# Patient Record
Sex: Female | Born: 1990 | Race: White | Hispanic: No | Marital: Single | Smoking: Never smoker
Health system: Southern US, Community
[De-identification: ages and names within clinical notes are randomized; demographics above are authoritative.]

## PROBLEM LIST (undated history)

## (undated) ENCOUNTER — Inpatient Hospital Stay (HOSPITAL_COMMUNITY): Payer: Self-pay

## (undated) DIAGNOSIS — R51 Headache: Secondary | ICD-10-CM

## (undated) DIAGNOSIS — I10 Essential (primary) hypertension: Secondary | ICD-10-CM

## (undated) HISTORY — PX: WISDOM TOOTH EXTRACTION: SHX21

## (undated) HISTORY — PX: CHOLECYSTECTOMY: SHX55

---
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
## 2008-01-14 ENCOUNTER — Ambulatory Visit: Payer: Self-pay | Admitting: Family Medicine

## 2008-01-21 ENCOUNTER — Emergency Department (HOSPITAL_COMMUNITY): Admission: EM | Admit: 2008-01-21 | Discharge: 2008-01-21 | Payer: Self-pay | Admitting: Family Medicine

## 2008-01-21 ENCOUNTER — Encounter: Payer: Self-pay | Admitting: *Deleted

## 2008-01-23 ENCOUNTER — Encounter (INDEPENDENT_AMBULATORY_CARE_PROVIDER_SITE_OTHER): Payer: Self-pay | Admitting: Family Medicine

## 2008-01-23 ENCOUNTER — Ambulatory Visit: Payer: Self-pay | Admitting: Family Medicine

## 2008-01-23 LAB — CONVERTED CEMR LAB
Antibody Screen: NEGATIVE
Basophils Absolute: 0.1 10*3/uL (ref 0.0–0.1)
Basophils Relative: 1 % (ref 0–1)
Eosinophils Absolute: 0.1 10*3/uL (ref 0.0–0.7)
Eosinophils Relative: 1 % (ref 0–5)
HCT: 38.3 % (ref 36.0–46.0)
Hemoglobin: 12.8 g/dL (ref 12.0–15.0)
Hepatitis B Surface Ag: NEGATIVE
Lymphocytes Relative: 30 % (ref 12–46)
Lymphs Abs: 2.4 10*3/uL (ref 0.7–4.0)
MCHC: 33.4 g/dL (ref 30.0–36.0)
MCV: 87.2 fL (ref 78.0–100.0)
Monocytes Absolute: 0.6 10*3/uL (ref 0.1–1.0)
Monocytes Relative: 7 % (ref 3–12)
Neutro Abs: 4.9 10*3/uL (ref 1.7–7.7)
Neutrophils Relative %: 61 % (ref 43–77)
Platelets: 332 10*3/uL (ref 150–400)
RBC: 4.39 M/uL (ref 3.87–5.11)
RDW: 12.8 % (ref 11.5–15.5)
Rh Type: POSITIVE
Rubella: 30.6 intl units/mL — ABNORMAL HIGH
Sickle Cell Screen: NEGATIVE
WBC: 8 10*3/uL (ref 4.0–10.5)

## 2008-01-24 ENCOUNTER — Encounter (INDEPENDENT_AMBULATORY_CARE_PROVIDER_SITE_OTHER): Payer: Self-pay | Admitting: Family Medicine

## 2008-01-30 ENCOUNTER — Other Ambulatory Visit: Admission: RE | Admit: 2008-01-30 | Discharge: 2008-01-30 | Payer: Self-pay | Admitting: Emergency Medicine

## 2008-01-30 ENCOUNTER — Encounter (INDEPENDENT_AMBULATORY_CARE_PROVIDER_SITE_OTHER): Payer: Self-pay | Admitting: Family Medicine

## 2008-01-30 ENCOUNTER — Ambulatory Visit: Payer: Self-pay | Admitting: Family Medicine

## 2008-01-30 LAB — CONVERTED CEMR LAB
Chlamydia, DNA Probe: NEGATIVE
GC Probe Amp, Genital: NEGATIVE
Glucose, Urine, Semiquant: NEGATIVE
Protein, U semiquant: NEGATIVE
Rapid Strep: POSITIVE

## 2008-02-02 ENCOUNTER — Ambulatory Visit (HOSPITAL_COMMUNITY): Admission: RE | Admit: 2008-02-02 | Discharge: 2008-02-02 | Payer: Self-pay | Admitting: Family Medicine

## 2008-02-02 ENCOUNTER — Encounter (INDEPENDENT_AMBULATORY_CARE_PROVIDER_SITE_OTHER): Payer: Self-pay | Admitting: Family Medicine

## 2008-02-04 ENCOUNTER — Telehealth (INDEPENDENT_AMBULATORY_CARE_PROVIDER_SITE_OTHER): Payer: Self-pay | Admitting: Family Medicine

## 2008-03-23 ENCOUNTER — Telehealth: Payer: Self-pay | Admitting: *Deleted

## 2008-03-24 ENCOUNTER — Ambulatory Visit: Payer: Self-pay | Admitting: Family Medicine

## 2008-04-02 ENCOUNTER — Encounter: Admission: RE | Admit: 2008-04-02 | Discharge: 2008-04-02 | Payer: Self-pay | Admitting: Family Medicine

## 2008-04-02 ENCOUNTER — Ambulatory Visit: Payer: Self-pay | Admitting: Family Medicine

## 2008-04-02 ENCOUNTER — Telehealth (INDEPENDENT_AMBULATORY_CARE_PROVIDER_SITE_OTHER): Payer: Self-pay | Admitting: *Deleted

## 2008-04-02 ENCOUNTER — Encounter: Payer: Self-pay | Admitting: Family Medicine

## 2008-04-06 ENCOUNTER — Ambulatory Visit: Payer: Self-pay | Admitting: Family Medicine

## 2008-04-21 ENCOUNTER — Ambulatory Visit (HOSPITAL_COMMUNITY): Admission: RE | Admit: 2008-04-21 | Discharge: 2008-04-21 | Payer: Self-pay | Admitting: Family Medicine

## 2008-04-24 ENCOUNTER — Inpatient Hospital Stay (HOSPITAL_COMMUNITY): Admission: AD | Admit: 2008-04-24 | Discharge: 2008-04-24 | Payer: Self-pay | Admitting: Obstetrics & Gynecology

## 2008-05-17 ENCOUNTER — Ambulatory Visit: Payer: Self-pay | Admitting: Family Medicine

## 2008-06-06 ENCOUNTER — Telehealth (INDEPENDENT_AMBULATORY_CARE_PROVIDER_SITE_OTHER): Payer: Self-pay | Admitting: Family Medicine

## 2008-06-14 ENCOUNTER — Ambulatory Visit: Payer: Self-pay | Admitting: Family Medicine

## 2008-06-14 ENCOUNTER — Encounter (INDEPENDENT_AMBULATORY_CARE_PROVIDER_SITE_OTHER): Payer: Self-pay | Admitting: Family Medicine

## 2008-06-23 ENCOUNTER — Ambulatory Visit: Payer: Self-pay | Admitting: Family Medicine

## 2008-06-23 ENCOUNTER — Inpatient Hospital Stay (HOSPITAL_COMMUNITY): Admission: AD | Admit: 2008-06-23 | Discharge: 2008-06-23 | Payer: Self-pay | Admitting: Obstetrics & Gynecology

## 2008-07-04 ENCOUNTER — Inpatient Hospital Stay (HOSPITAL_COMMUNITY): Admission: AD | Admit: 2008-07-04 | Discharge: 2008-07-04 | Payer: Self-pay | Admitting: Obstetrics & Gynecology

## 2008-07-04 ENCOUNTER — Ambulatory Visit: Payer: Self-pay | Admitting: Advanced Practice Midwife

## 2008-07-05 ENCOUNTER — Inpatient Hospital Stay (HOSPITAL_COMMUNITY): Admission: AD | Admit: 2008-07-05 | Discharge: 2008-07-05 | Payer: Self-pay | Admitting: Obstetrics & Gynecology

## 2008-07-05 ENCOUNTER — Ambulatory Visit: Payer: Self-pay | Admitting: Obstetrics and Gynecology

## 2008-07-05 ENCOUNTER — Encounter: Payer: Self-pay | Admitting: *Deleted

## 2008-07-06 ENCOUNTER — Ambulatory Visit: Payer: Self-pay | Admitting: Family Medicine

## 2008-07-14 ENCOUNTER — Encounter: Payer: Self-pay | Admitting: Family Medicine

## 2008-07-14 ENCOUNTER — Ambulatory Visit: Payer: Self-pay | Admitting: Family Medicine

## 2008-07-30 ENCOUNTER — Ambulatory Visit: Payer: Self-pay | Admitting: Family Medicine

## 2008-08-10 ENCOUNTER — Ambulatory Visit: Payer: Self-pay | Admitting: Advanced Practice Midwife

## 2008-08-10 ENCOUNTER — Inpatient Hospital Stay (HOSPITAL_COMMUNITY): Admission: AD | Admit: 2008-08-10 | Discharge: 2008-08-11 | Payer: Self-pay | Admitting: Obstetrics & Gynecology

## 2008-08-12 ENCOUNTER — Encounter (INDEPENDENT_AMBULATORY_CARE_PROVIDER_SITE_OTHER): Payer: Self-pay | Admitting: Family Medicine

## 2008-08-12 ENCOUNTER — Ambulatory Visit: Payer: Self-pay | Admitting: Family Medicine

## 2008-08-12 LAB — CONVERTED CEMR LAB
Alkaline Phosphatase: 153 units/L — ABNORMAL HIGH (ref 39–117)
CO2: 20 meq/L (ref 19–32)
Creatinine, Ser: 0.57 mg/dL (ref 0.40–1.20)
Glucose, Bld: 72 mg/dL (ref 70–99)
HCT: 34.6 % — ABNORMAL LOW (ref 36.0–46.0)
MCHC: 33.8 g/dL (ref 30.0–36.0)
MCV: 87.8 fL (ref 78.0–100.0)
Platelets: 293 10*3/uL (ref 150–400)
RBC: 3.94 M/uL (ref 3.87–5.11)
Total Bilirubin: 0.5 mg/dL (ref 0.3–1.2)
WBC: 8.7 10*3/uL (ref 4.0–10.5)

## 2008-08-14 ENCOUNTER — Encounter (INDEPENDENT_AMBULATORY_CARE_PROVIDER_SITE_OTHER): Payer: Self-pay | Admitting: Family Medicine

## 2008-08-14 LAB — CONVERTED CEMR LAB: Protein, Ur: 52 mg/24hr (ref 50–100)

## 2008-08-17 ENCOUNTER — Ambulatory Visit: Payer: Self-pay | Admitting: Family Medicine

## 2008-08-25 ENCOUNTER — Ambulatory Visit: Payer: Self-pay | Admitting: Family Medicine

## 2008-08-25 ENCOUNTER — Encounter: Payer: Self-pay | Admitting: Family Medicine

## 2008-08-25 LAB — CONVERTED CEMR LAB: Chlamydia, DNA Probe: NEGATIVE

## 2008-08-26 ENCOUNTER — Encounter (INDEPENDENT_AMBULATORY_CARE_PROVIDER_SITE_OTHER): Payer: Self-pay | Admitting: Family Medicine

## 2008-08-29 ENCOUNTER — Inpatient Hospital Stay (HOSPITAL_COMMUNITY): Admission: AD | Admit: 2008-08-29 | Discharge: 2008-08-29 | Payer: Self-pay | Admitting: Obstetrics & Gynecology

## 2008-09-02 ENCOUNTER — Ambulatory Visit: Payer: Self-pay | Admitting: Family Medicine

## 2008-09-02 ENCOUNTER — Encounter: Payer: Self-pay | Admitting: Family Medicine

## 2008-09-05 ENCOUNTER — Inpatient Hospital Stay (HOSPITAL_COMMUNITY): Admission: AD | Admit: 2008-09-05 | Discharge: 2008-09-05 | Payer: Self-pay | Admitting: Obstetrics and Gynecology

## 2008-09-07 ENCOUNTER — Ambulatory Visit: Payer: Self-pay | Admitting: Advanced Practice Midwife

## 2008-09-07 ENCOUNTER — Inpatient Hospital Stay (HOSPITAL_COMMUNITY): Admission: AD | Admit: 2008-09-07 | Discharge: 2008-09-07 | Payer: Self-pay | Admitting: Obstetrics & Gynecology

## 2008-09-08 ENCOUNTER — Inpatient Hospital Stay (HOSPITAL_COMMUNITY): Admission: AD | Admit: 2008-09-08 | Discharge: 2008-09-10 | Payer: Self-pay | Admitting: Obstetrics & Gynecology

## 2008-09-08 ENCOUNTER — Ambulatory Visit: Payer: Self-pay | Admitting: Family Medicine

## 2008-10-18 ENCOUNTER — Encounter: Payer: Self-pay | Admitting: Family Medicine

## 2008-10-18 ENCOUNTER — Ambulatory Visit: Payer: Self-pay | Admitting: Family Medicine

## 2008-10-18 LAB — CONVERTED CEMR LAB: Beta hcg, urine, semiquantitative: NEGATIVE

## 2008-12-09 ENCOUNTER — Emergency Department (HOSPITAL_COMMUNITY): Admission: EM | Admit: 2008-12-09 | Discharge: 2008-12-09 | Payer: Self-pay | Admitting: Family Medicine

## 2008-12-10 ENCOUNTER — Emergency Department (HOSPITAL_COMMUNITY): Admission: EM | Admit: 2008-12-10 | Discharge: 2008-12-10 | Payer: Self-pay | Admitting: Emergency Medicine

## 2008-12-11 ENCOUNTER — Emergency Department (HOSPITAL_COMMUNITY): Admission: EM | Admit: 2008-12-11 | Discharge: 2008-12-12 | Payer: Self-pay | Admitting: Emergency Medicine

## 2008-12-13 ENCOUNTER — Encounter (INDEPENDENT_AMBULATORY_CARE_PROVIDER_SITE_OTHER): Payer: Self-pay | Admitting: Surgery

## 2008-12-13 ENCOUNTER — Inpatient Hospital Stay (HOSPITAL_COMMUNITY): Admission: EM | Admit: 2008-12-13 | Discharge: 2008-12-14 | Payer: Self-pay | Admitting: Emergency Medicine

## 2008-12-15 ENCOUNTER — Telehealth: Payer: Self-pay | Admitting: Family Medicine

## 2008-12-29 ENCOUNTER — Ambulatory Visit: Payer: Self-pay | Admitting: Family Medicine

## 2008-12-29 LAB — CONVERTED CEMR LAB: Beta hcg, urine, semiquantitative: NEGATIVE

## 2009-03-01 ENCOUNTER — Emergency Department (HOSPITAL_COMMUNITY): Admission: EM | Admit: 2009-03-01 | Discharge: 2009-03-01 | Payer: Self-pay | Admitting: Emergency Medicine

## 2009-04-05 ENCOUNTER — Ambulatory Visit: Payer: Self-pay | Admitting: Family Medicine

## 2009-04-06 ENCOUNTER — Ambulatory Visit: Payer: Self-pay | Admitting: Family Medicine

## 2009-04-06 LAB — CONVERTED CEMR LAB: Beta hcg, urine, semiquantitative: NEGATIVE

## 2009-05-03 ENCOUNTER — Telehealth: Payer: Self-pay | Admitting: Family Medicine

## 2009-05-03 ENCOUNTER — Ambulatory Visit: Payer: Self-pay | Admitting: Family Medicine

## 2009-05-03 ENCOUNTER — Encounter: Payer: Self-pay | Admitting: Family Medicine

## 2009-06-24 ENCOUNTER — Ambulatory Visit: Payer: Self-pay | Admitting: Family Medicine

## 2009-06-24 ENCOUNTER — Telehealth: Payer: Self-pay | Admitting: Family Medicine

## 2009-08-26 ENCOUNTER — Ambulatory Visit: Payer: Self-pay | Admitting: Family Medicine

## 2009-08-26 DIAGNOSIS — M546 Pain in thoracic spine: Secondary | ICD-10-CM | POA: Insufficient documentation

## 2009-08-26 LAB — CONVERTED CEMR LAB
Beta hcg, urine, semiquantitative: NEGATIVE
Nitrite: NEGATIVE
Urobilinogen, UA: 0.2
WBC Urine, dipstick: NEGATIVE

## 2009-12-15 ENCOUNTER — Emergency Department (HOSPITAL_COMMUNITY): Admission: EM | Admit: 2009-12-15 | Discharge: 2009-12-15 | Payer: Self-pay | Admitting: Emergency Medicine

## 2009-12-27 ENCOUNTER — Ambulatory Visit: Payer: Self-pay | Admitting: Family Medicine

## 2009-12-27 ENCOUNTER — Encounter: Payer: Self-pay | Admitting: Family Medicine

## 2009-12-30 ENCOUNTER — Ambulatory Visit: Payer: Self-pay | Admitting: Family Medicine

## 2010-01-10 ENCOUNTER — Encounter: Payer: Self-pay | Admitting: Family Medicine

## 2010-01-10 ENCOUNTER — Ambulatory Visit: Payer: Self-pay | Admitting: Family Medicine

## 2010-01-10 LAB — CONVERTED CEMR LAB
Antibody Screen: NEGATIVE
Basophils Absolute: 0 10*3/uL (ref 0.0–0.1)
Basophils Relative: 0 % (ref 0–1)
Eosinophils Absolute: 0 10*3/uL (ref 0.0–0.7)
Hepatitis B Surface Ag: NEGATIVE
MCHC: 32.4 g/dL (ref 30.0–36.0)
MCV: 90 fL (ref 78.0–100.0)
Monocytes Relative: 6 % (ref 3–12)
Neutro Abs: 4.5 10*3/uL (ref 1.7–7.7)
Neutrophils Relative %: 63 % (ref 43–77)
Platelets: 347 10*3/uL (ref 150–400)
RDW: 13.2 % (ref 11.5–15.5)

## 2010-01-18 ENCOUNTER — Encounter: Payer: Self-pay | Admitting: *Deleted

## 2010-01-18 ENCOUNTER — Inpatient Hospital Stay (HOSPITAL_COMMUNITY): Admission: AD | Admit: 2010-01-18 | Discharge: 2010-01-18 | Payer: Self-pay | Admitting: Obstetrics & Gynecology

## 2010-01-25 ENCOUNTER — Ambulatory Visit: Payer: Self-pay | Admitting: Family Medicine

## 2010-01-25 ENCOUNTER — Encounter: Payer: Self-pay | Admitting: *Deleted

## 2010-01-25 LAB — CONVERTED CEMR LAB
Pap Smear: NEGATIVE
Whiff Test: NEGATIVE

## 2010-02-01 ENCOUNTER — Encounter: Payer: Self-pay | Admitting: Family Medicine

## 2010-02-02 ENCOUNTER — Ambulatory Visit (HOSPITAL_COMMUNITY): Admission: RE | Admit: 2010-02-02 | Discharge: 2010-02-02 | Payer: Self-pay | Admitting: Family Medicine

## 2010-02-02 ENCOUNTER — Encounter: Payer: Self-pay | Admitting: Family Medicine

## 2010-02-21 ENCOUNTER — Encounter: Payer: Self-pay | Admitting: Family Medicine

## 2010-02-21 ENCOUNTER — Ambulatory Visit: Payer: Self-pay | Admitting: Family Medicine

## 2010-02-21 LAB — CONVERTED CEMR LAB
Albumin: 3.8 g/dL (ref 3.5–5.2)
BUN: 6 mg/dL (ref 6–23)
Bilirubin Urine: NEGATIVE
Calcium: 9.2 mg/dL (ref 8.4–10.5)
Chloride: 105 meq/L (ref 96–112)
Glucose, Bld: 101 mg/dL — ABNORMAL HIGH (ref 70–99)
Potassium: 3.7 meq/L (ref 3.5–5.3)
Specific Gravity, Urine: >=1.03
Urobilinogen, UA: 4

## 2010-03-15 ENCOUNTER — Encounter: Payer: Self-pay | Admitting: Family Medicine

## 2010-03-15 ENCOUNTER — Ambulatory Visit (HOSPITAL_COMMUNITY): Admission: RE | Admit: 2010-03-15 | Discharge: 2010-03-15 | Payer: Self-pay | Admitting: Family Medicine

## 2010-03-20 ENCOUNTER — Ambulatory Visit: Payer: Self-pay | Admitting: Family Medicine

## 2010-04-13 ENCOUNTER — Ambulatory Visit: Payer: Self-pay

## 2010-04-28 ENCOUNTER — Ambulatory Visit: Payer: Self-pay | Admitting: Family Medicine

## 2010-05-04 ENCOUNTER — Telehealth: Payer: Self-pay | Admitting: Family Medicine

## 2010-05-04 ENCOUNTER — Inpatient Hospital Stay (HOSPITAL_COMMUNITY)
Admission: AD | Admit: 2010-05-04 | Discharge: 2010-05-05 | Payer: Self-pay | Source: Home / Self Care | Attending: Obstetrics & Gynecology | Admitting: Obstetrics & Gynecology

## 2010-05-04 LAB — CBC
HCT: 36.7 % (ref 36.0–46.0)
Hemoglobin: 12.8 g/dL (ref 12.0–15.0)
MCH: 30.7 pg (ref 26.0–34.0)
MCHC: 34.9 g/dL (ref 30.0–36.0)
MCV: 88 fL (ref 78.0–100.0)
Platelets: 295 10*3/uL (ref 150–400)
RBC: 4.17 MIL/uL (ref 3.87–5.11)
RDW: 13.1 % (ref 11.5–15.5)
WBC: 10 10*3/uL (ref 4.0–10.5)

## 2010-05-07 IMAGING — CR DG ABDOMEN ACUTE W/ 1V CHEST
3 series · 3 of 3 positions shown · non-contrast
Comparison: None

CLINICAL DATA: Back and abdominal pain.

ACUTE ABDOMEN SERIES (ABDOMEN 2 VIEW & CHEST 1 VIEW)

[w chest pa]
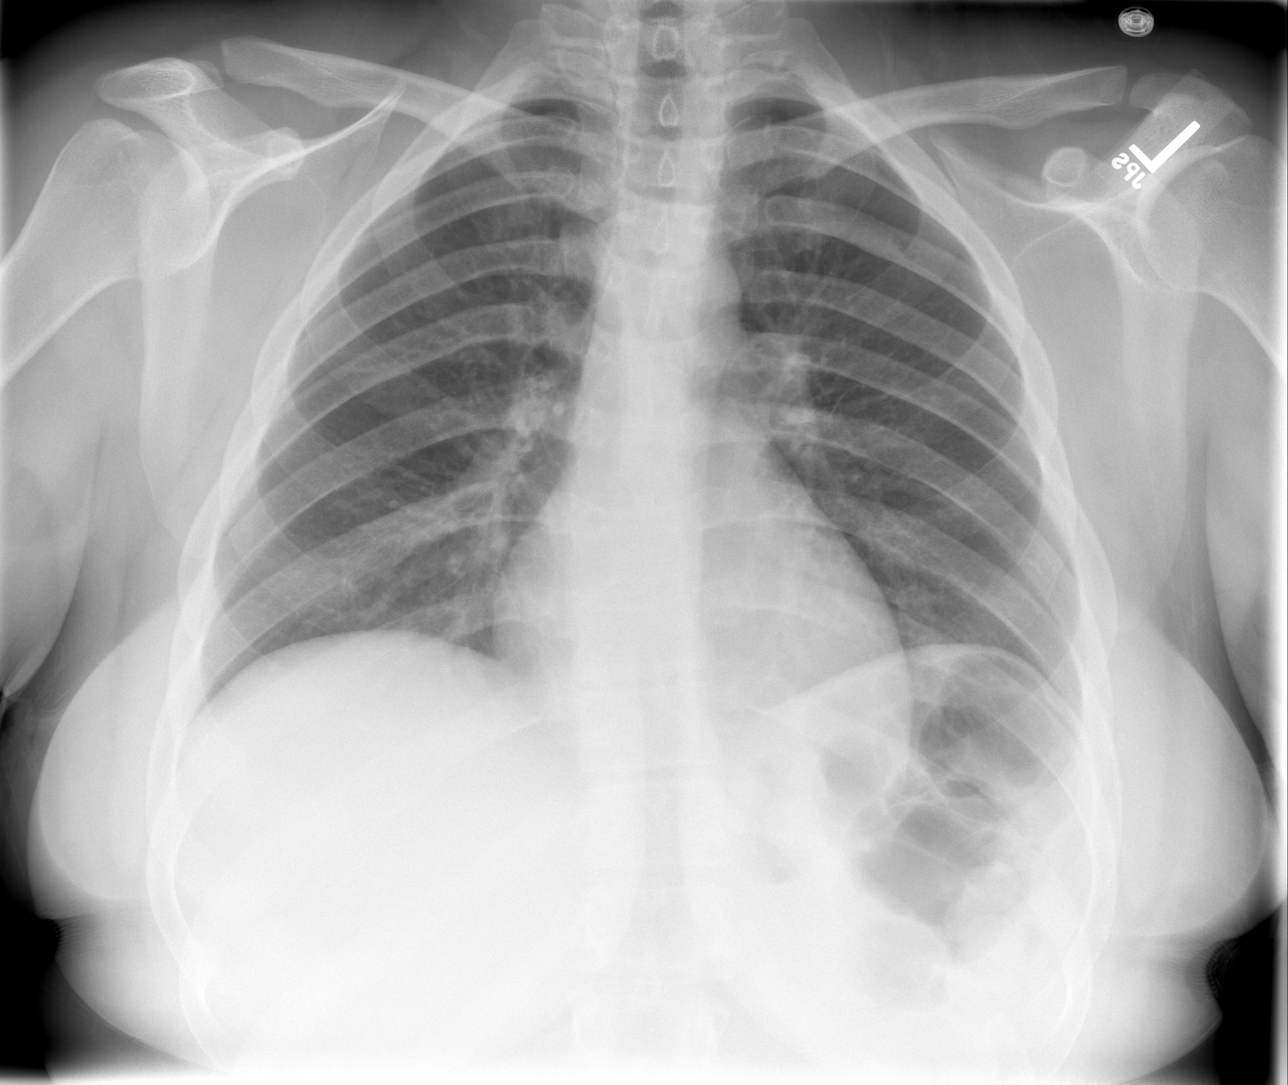

[w abdomen upright]
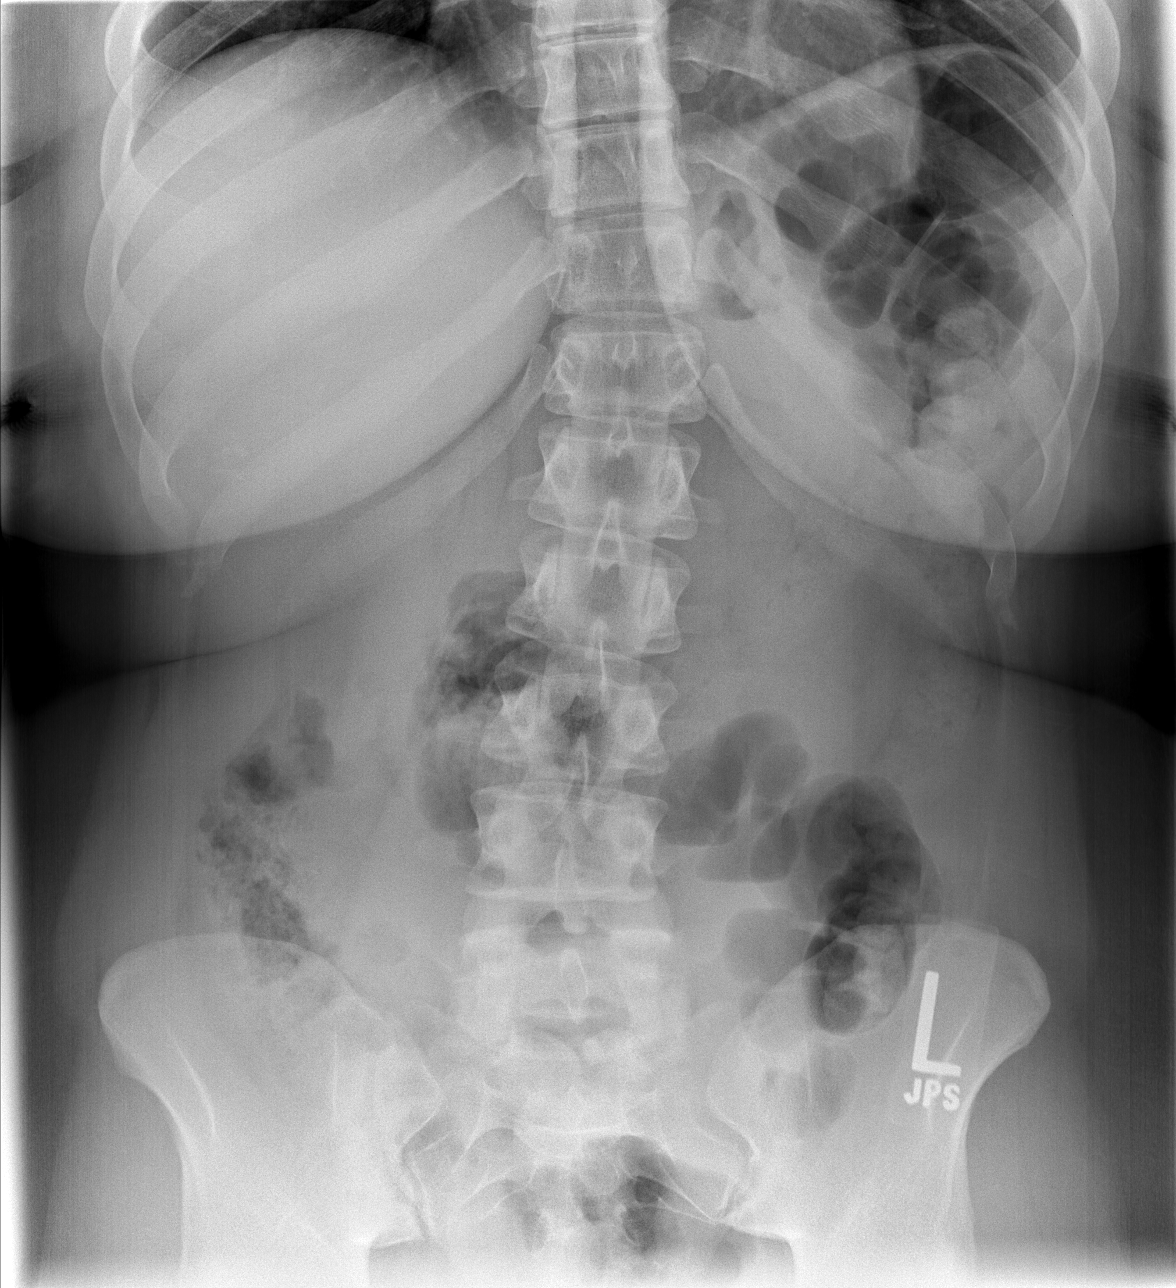

[t abdomen supine]
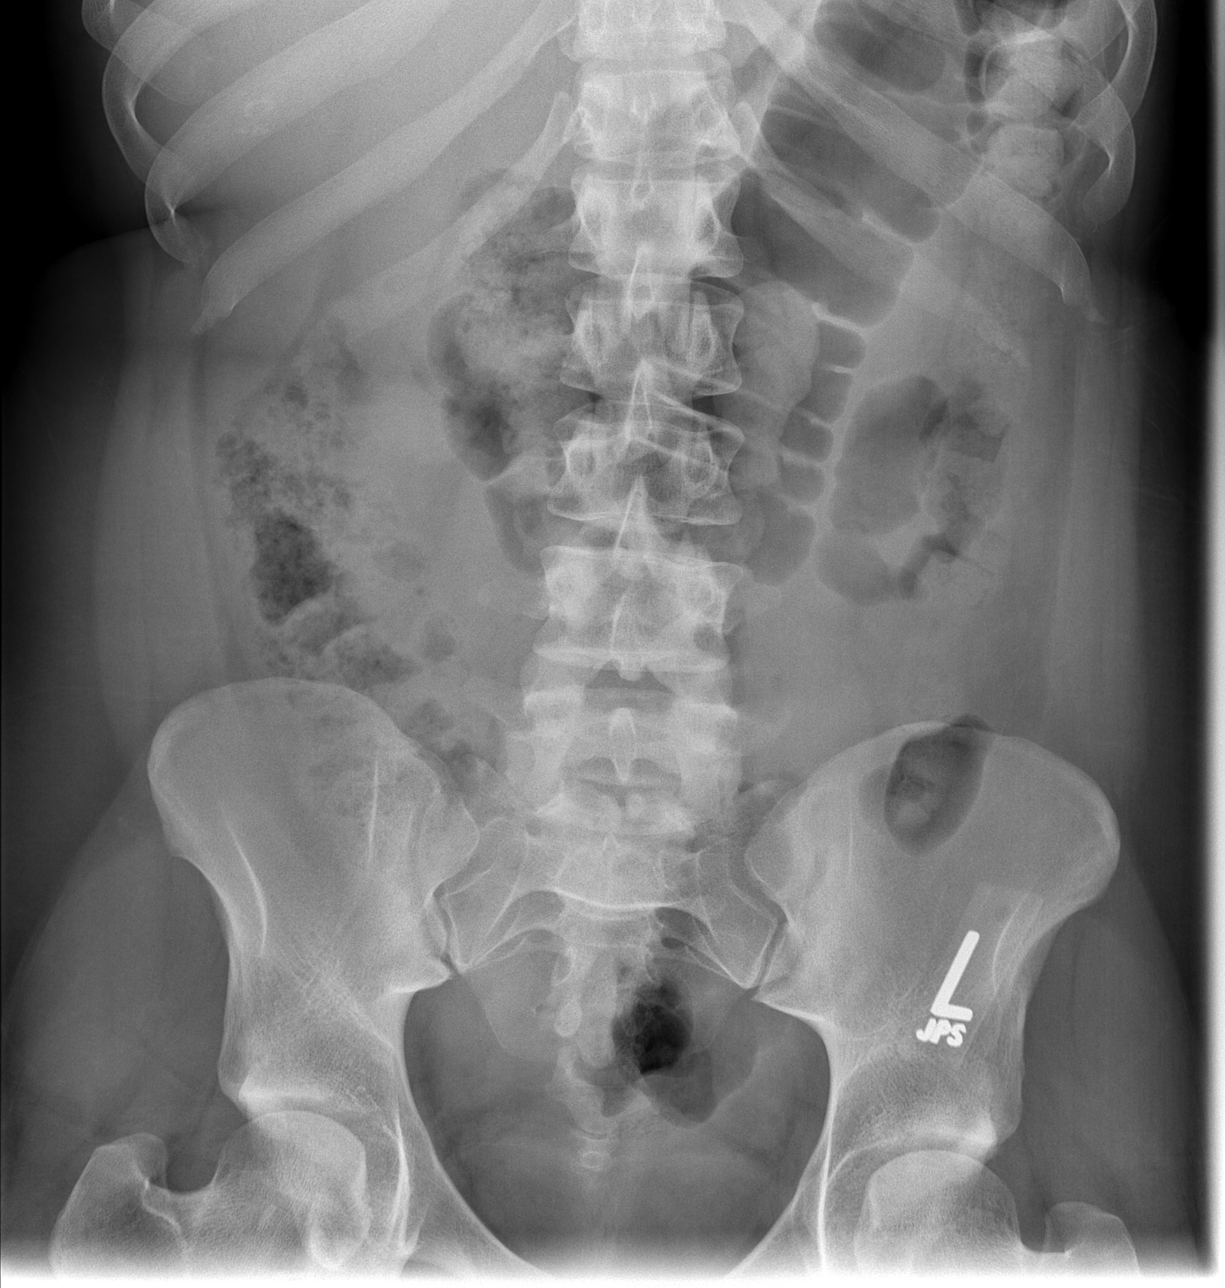

[3 of 3 positions shown; findings below may reference images not displayed]

FINDINGS: The upright chest x-ray demonstrates no acute
cardiopulmonary findings.  Low lung volumes with minimal basilar
atelectasis.

Two views of the abdomen demonstrate an unremarkable bowel gas
pattern.  There is scattered air and stool throughout the colon.
No dilated loops of small bowel to suggest obstruction.  No free
air.  The soft tissue shadows of the abdomen are maintained.  No
worrisome calcifications are seen.  The bony structures are
unremarkable.  There is a mild thoracolumbar scoliotic curvature
noted.
IMPRESSION: 1.  No acute cardiopulmonary findings.
2.  No plain film findings for acute abdominal process.

## 2010-05-10 ENCOUNTER — Ambulatory Visit
Admission: RE | Admit: 2010-05-10 | Discharge: 2010-05-10 | Payer: Self-pay | Source: Home / Self Care | Attending: Family Medicine | Admitting: Family Medicine

## 2010-05-10 ENCOUNTER — Encounter: Payer: Self-pay | Admitting: Family Medicine

## 2010-05-10 DIAGNOSIS — A088 Other specified intestinal infections: Secondary | ICD-10-CM | POA: Insufficient documentation

## 2010-05-15 ENCOUNTER — Encounter: Payer: Self-pay | Admitting: Family Medicine

## 2010-05-15 ENCOUNTER — Ambulatory Visit: Admission: RE | Admit: 2010-05-15 | Discharge: 2010-05-15 | Payer: Self-pay | Source: Home / Self Care

## 2010-05-15 LAB — CONVERTED CEMR LAB
HIV: NONREACTIVE
MCHC: 33.2 g/dL (ref 30.0–36.0)
MCV: 89.6 fL (ref 78.0–100.0)
Platelets: 298 10*3/uL (ref 150–400)
RDW: 13.2 % (ref 11.5–15.5)

## 2010-05-17 LAB — GLUCOSE, CAPILLARY: Glucose-Capillary: 101 mg/dL — ABNORMAL HIGH (ref 70–99)

## 2010-05-18 ENCOUNTER — Ambulatory Visit: Admit: 2010-05-18 | Payer: Self-pay | Admitting: Family Medicine

## 2010-05-20 ENCOUNTER — Encounter: Payer: Self-pay | Admitting: Family Medicine

## 2010-05-21 ENCOUNTER — Encounter: Payer: Self-pay | Admitting: Family Medicine

## 2010-05-22 ENCOUNTER — Encounter: Payer: Self-pay | Admitting: Emergency Medicine

## 2010-05-29 ENCOUNTER — Ambulatory Visit: Admit: 2010-05-29 | Payer: Self-pay

## 2010-05-30 NOTE — Assessment & Plan Note (Signed)
Summary: NOB/KH   Vital Signs:  Patient profile:   20 year old female LMP:     11/02/2009 Height:      67.5 inches Weight:      255 pounds Pulse rate:   80 / minute BP sitting:   116 / 75  Vitals Entered By: Jone Baseman CMA (January 25, 2010 3:44 PM)  Primary Care Provider:  Angelena Sole MD   History of Present Illness: No questions or concerns  Was seen at [facility] for small amount of vaginal bleeding.  No bleeding since then.  They treated her for a UTI.  Has had problems with nausea and has been taking Phenergan as needed.  Habits & Providers  Alcohol-Tobacco-Diet     Cigarette Packs/Day: n/a  Current Medications (verified): 1)  Zofran 4 Mg Tabs (Ondansetron Hcl) .Marland Kitchen.. 1 By Mouth Q6 Hrs As Needed Nausea 2)  Ranitidine Hcl 150 Mg Caps (Ranitidine Hcl) .... Take 1 Pill Two Times A Day For Heartburn. 3)  Flintstones Complete 60 Mg Chew (Pediatric Multivit-Minerals-C)  Allergies: No Known Drug Allergies  Past History:  Past Medical History: Reviewed history from 01/14/2008 and no changes required. chronic low back pain h/o `hairline fracture` of L hip playing bball 06 obesity   Family History: Reviewed history from 06/03/2007 and no changes required. DM type 2 in all grandparents Father with `heart trouble` and ?TIA in 80s - `he won`t talk about it` Half brothers with asthma, bipolar, and ADHD Mother healthy HTN- grandma  Social History: Reviewed history from 12/27/2009 and no changes required. Lives with toddler son. Currently works at [employer]. Non-smoker. Education:  freshman in college  Review of Systems  The patient denies fever, weight loss, syncope, peripheral edema, prolonged cough, headaches, and abdominal pain.    Physical Exam  General:  Vitals reviewed.  obese, no acute distress Eyes:  PERRL Mouth:  pharynx pink and moist.  pharynx pink and moist.   Neck:  supple, full ROM, and no masses.  supple, full ROM, and no masses.   Lungs:   normal respiratory effort, normal breath sounds, no crackles, and no wheezes.  normal respiratory effort, normal breath sounds, no crackles, and no wheezes.   Heart:  normal rate, regular rhythm, and no murmur.  normal rate, regular rhythm, and no murmur.   Abdomen:  obese, S/NT/ND, gravid c/w dates Genitalia:  normal introitus, no external lesions, no vaginal discharge, and mucosa pink and moist.  cervical friability.  White discharge Extremities:  no lower extremity edema Neurologic:  alert & oriented X3, cranial nerves II-XII intact, and strength normal in all extremities.   Skin:  turgor normal and color normal.   Psych:  Oriented X3, not anxious appearing, and not depressed appearing.     Impression & Recommendations:  Problem # 1:  PREGNANCY, MULTIGRAVIDA (ICD-V22.1) Assessment Unchanged  G3P1011.  Having some issues with nausea but is otherwise doing okay.  Will have her set up for ultrasound to help with dates and anatomy.  Also have her set up for Quad screen.  PAP smear today.  Other labs look okay.  She will need an early 1 hour glucola given obesity, AA, and family history.  Would do at next visit.  She is also having some vaginal discharge so will check a wet prep.  Orders: Other OB visit- [facility] ([facility])  Problem # 2:  MORNING SICKNESS (ICD-643.00) Assessment: Unchanged Still having problems with nausea.  She is taking the Phenergan only once in a while.  I educated her about the risks of taking Phenergan and she voiced her understanding.  Complete Medication List: 1)  Zofran 4 Mg Tabs (Ondansetron hcl) .Marland Kitchen.. 1 by mouth q6 hrs as needed nausea 2)  Ranitidine Hcl 150 Mg Caps (Ranitidine hcl) .... Take 1 pill two times a day for heartburn. 3)  Flintstones Complete 60 Mg Chew (Pediatric multivit-minerals-c)  Other Orders: [facility] ([id number]) [facility] Prenatal U/S > 14 weeks - 91478 (Prenatal U/S) AFP/Quad Scr-[facility] ([id number])  Patient Instructions: 1)   It was good to see you today 2)  I will let you know the results of the PAP smear 3)  We will also get you set up for an ultrasound for dates and sex of the baby 4)  Please schedule a follow up appointment with Dr. Gwendolyn Grant in 4 weeks   OB Initial Intake Information    Positive HCG by: Urgent Care    Race: White    Marital status: Single    Occupation: Conservation officer, [employer] for [employer]    Type of work: Copywriter, advertising ed    Education (last grade completed): freshman in college    Number of children at home: 1    [facility]    Newborn's physician: shaw  FOB Information    Husband/Father of baby: Kendra [name]    FOB occupation [employer]    Phone: [phone]    FOB Comments: Very involved.   Menstrual History    LMP (date): 11/02/2009    EDC by LMP: 08/09/2010    LMP - Character: light    Menarche: 11 years    Menses interval: 30 days    Menstrual flow 7 days    On BCP's at conception: no   Flowsheet View for Follow-up Visit    Estimated weeks of       gestation:     16.1    Weight:     255    Blood pressure:   116 / 75    Headache:     No    Nausea/vomiting:   vom2/d    Edema:     0    Vaginal bleeding:   no    Vaginal discharge:   d/c    Fundal height:      17    FHR:       160    Fetal activity:     no    Labor symptoms:   no    Taking prenatal vits?   Y    Smoking:     n/a    Next visit:     4 wk    Resident:     Lelon Perla    Preceptor:     Jennette Kettle  Prenatal Visit [facility] Confirmation:    Last menses onset (LMP) date: 11/02/2009    [facility] by LMP: 08/09/2010   Past Pregnancy History    Gravida:     3    Term Births:     1    Living Children:   1    Para:       1    Aborta:     1    Spont. Ab:     1  Pregnancy # 1    Delivery date:     09/08/2008    Weeks Gestation:   38    Preterm labor:     no    Delivery type:     NSVD  Anesthesia type:     epidural    Infant Sex:     Female    Birth weight:     6#2    Name:     Kendra [name]    Comments:      No complications  Pregnancy # 2    Delivery date:     01/08/2007    Comments:     Spontaneous abortion   Genetic History    Father of baby:   Kendra [name]     Thalassemia:         father: no    Neural tube defect:       father: no    Down's Syndrome:       father: no    Tay-Sachs:         father: no    Sickle Cell Dz/Trait:       father: no    Hemophilia:         father: no    Muscular Dystrophy:       father: no    Cystic Fibrosis:       father: no    Huntington's Dz:       father: no    Mental Retardation:       father: no    Fragile X:         father: no    Other Genetic or       Chromosomal Dz:       father: no    Child with other       birth defect:         father: no  Laboratory Results  Date/Time Received: January 25, 2010 4:34 PM  Date/Time Reported: January 25, 2010 4:38 PM   [employer] Source: vaginal WBC/hpf: 10-20 Bacteria/hpf: 3+  Cocci Clue cells/hpf: few  Negative whiff Yeast/hpf: none Trichomonas/hpf: none Comments: rod bacteria also present ............test performed by...........Marland KitchenTerese Door, CMA     Appended Document: NOB/KH    Clinical Lists Changes  Observations: Added new observation of PRECEPTOR: Jennette Kettle (01/26/2010 11:00) Added new observation of RESIDENT MD: Lelon Perla (01/26/2010 11:00) Added new observation of RTC: 4 wk (01/26/2010 11:00) Added new observation of SMOK HX PPD: n/a (01/26/2010 11:00) Added new observation of TAKING VITAM: Y (01/26/2010 11:00) Added new observation of SGN/SX PRETM: no (01/26/2010 11:00) Added new observation of FETAL MOVE: no (01/26/2010 11:00) Added new observation of FETAL HEART: 160  (01/26/2010 11:00) Added new observation of FUNDAL HT CM: 17 cm (01/26/2010 11:00) Added new observation of AMNIOTC LEAK: d/c  (01/26/2010 11:00) Added new observation of BLD PREG YN: no  (01/26/2010 11:00) Added new observation of EDEMA: 0  (01/26/2010 11:00) Added new observation of N/V PREG: vom2/d   (01/26/2010 11:00) Added new observation of PMH HEADACHE: No  (01/26/2010 11:00) Added new observation of WEIGHT: 255 lb (01/26/2010 11:00) Added new observation of GEST AGE EST: 12.2 wk (01/26/2010 11:00) Added new observation of EDD BY LMP: 08/09/2010  (01/26/2010 11:00)       OB Initial Intake Information    Positive HCG by: Urgent Care    Race: White    Marital status: Single    Occupation: Conservation officer, [employer]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   Menstrual History    LMP (date): 11/02/2009    EDC by LMP: 08/09/2010    LMP - Character: light    Menarche: 11 years    Menses interval: 30 days    Menstrual flow 7 days    On BCP's at conception: no  Flowsheet View for Follow-up Visit    Estimated weeks of       gestation:     12.2    Weight:     255    Headache:     No    Nausea/vomiting:   vom2/d    Edema:     0    Vaginal bleeding:   no    Vaginal discharge:   d/c    Fundal height:      17    FHR:       160    Fetal activity:     no    Labor symptoms:   no    Taking prenatal vits?   Y    Smoking:     n/a    Next visit:     4 wk    Resident:     Lelon Perla    Preceptor:     Jennette Kettle  Appended Document: NOB/KH Will need early glucola as soon as possible.  Dating sono already  ordered to confirm dates, since pt was on OCPs at conception.    Appended Document: NOB/KH   Appended Document: NOB/KH Agree with early 1hr GTT; may perform this nonfasting test before next visit (lab-only appt).  Appended Document: NOB/KH Please arrange for pt to come in for a 1 hour glucola nurse visit before next office visit  Appended Document: NOB/KH

## 2010-05-30 NOTE — Assessment & Plan Note (Signed)
Summary: pain in stomach,tcb   Vital Signs:  Patient profile:   20 year old female Height:      67.5 inches Weight:      276.8 pounds BMI:     42.87 Temp:     98.7 degrees F oral Pulse rate:   80 / minute BP sitting:   132 / 82  (left arm) Cuff size:   large  Vitals Entered By: Garen Grams LPN (August 26, 2009 11:02 AM) CC: sharp pains in lower abd x 1 week Is Patient Diabetic? No Pain Assessment Patient in pain? yes     Location: lower abd Intensity: 7   CC:  sharp pains in lower abd x 1 week.  History of Present Illness: 1. back pain / side pain Has had 1 week of bilateral side/back pain. Has taken ibuprofen that has helped a little. has never had  pain like this before. Due to have her period in the next few days. Usually has lower abd pain before period but not side/back pain. Describes the side pain as stabbing -- on both sides equally. Not affected by eating. Worse with standing for a long period of time and most noticeable at night when she lays down. -- works as a Conservation officer, nature.  Did not get depot  as scheduled in Feb. Would like to change to pills.  PMHx: has a history of irregular periods, prior to last pregnancy, but has been better recently  fevers:  no   chills: no    nausea:no     vomiting: no    diarrhea: occasional - 3 loose stools in past 48 hours     chest pain: no    shortness of breath: no     no dysuria, vaginal bleeding or discharge.   2. contraception Missed depot -- would like to change to OCPs. has been on Mirena and yaz in the past.   non-smoker.   Habits & Providers  Alcohol-Tobacco-Diet     Tobacco Status: never  Current Medications (verified): 1)  Ibuprofen 600 Mg Tabs (Ibuprofen) .... One By Mouth Every 6-8 Hours As Needed  Allergies (verified): No Known Drug Allergies  Review of Systems       review of systems as noted in HPI section   Physical Exam  General:  Vital signs reviewed -- obese but otherwise normal Alert, appropriate;  well-dressed and well-nourished  Neck:   no tenderness or masses  Lungs:  work of breathing unlabored, clear to auscultation bilaterally; no wheezes, rales, or ronchi; good air movement throughout  Heart:  regular rate and rhythm, no murmurs; normal s1/s2  Abdomen:  +BS, soft, non-tender, non-distended; no masses; no rebound or guarding; negative Murphy's. No CVAT -- benign exam Msk:  back exam: normal to inspection; no bony point tenderness; mild  paravertebral tenderness L > R around T11 +/- associated spasm. normal lateral flexion, normal forward flexion, somewhat painful extension; negative SLR bilaterally. DTRs symmetric. Neurologic:  grossly intact; alert and oriented. Non-antalgic gait. Smooth, easy transfers.   Impression & Recommendations:  Problem # 1:  BACK PAIN, THORACIC REGION (ICD-724.1) Assessment New  initally CC: decribed abd pain but on further questioning pt described thoracic back pain. likely paravertebral spasm. Benign exam. No CVAT. and UA unremarkable. Conservative measures for now. Emphasized the need to keep moving. Demonstrated proper lifting technique (son weighs 35 lbs).  Her updated medication list for this problem includes:    Ibuprofen 600 Mg Tabs (Ibuprofen) ..... One by mouth every 6-8  hours as needed    Flexeril 5 Mg Tabs (Cyclobenzaprine hcl) .Marland Kitchen... 1-2 by mouth every 8 hours as needed for back pain/spasm  Orders: [facility]- Est  Level 4 ([address]109)  Problem # 2:  CONTRACEPTIVE MANAGEMENT (ICD-V25.09) Assessment: Unchanged upreg negative. Start OCPs today. Orders: U Preg-[facility] ([address]454)  Complete Medication List: 1)  Ibuprofen 600 Mg Tabs (Ibuprofen) .... One by mouth every 6-8 hours as needed 2)  Flexeril 5 Mg Tabs (Cyclobenzaprine hcl) .Marland Kitchen.. 1-2 by mouth every 8 hours as needed for back pain/spasm 3)  Ortho Tri-cyclen Lo 0.18/0.215/0.25 Mg-25 Mcg Tabs (Norgestim-eth estrad triphasic) .... One by mouth daily per package instructions; disp 1 month supply  Other  Orders: Urinalysis-[facility] (00000)  Patient Instructions: 1)  apply heat 2-3 times per day for 20 minutes 2)  take the ibuprofen every 6 hours as needed 3)  take flexeril (muscle relaxer) at night -- you may increase up to 2 tablets every 8 hours  4)  follow-up if not improveing in the next 1-2 weeks or for any sudden worsening of symptoms. Prescriptions: FLEXERIL 5 MG TABS (CYCLOBENZAPRINE HCL) 1-2 by mouth every 8 hours as needed for back pain/spasm  #30 x 0   Entered and Authorized by:   Myrtie Soman  MD   Signed by:   Myrtie Soman  MD on 08/26/2009   Method used:   Electronically to        [facility] [address] (retail)       [address]       [place], [place]  [id number]       Ph: [phone]       Fax: [phone]   RxID:   [id number] ORTHO TRI-CYCLEN LO 0.18/0.215/0.25 MG-25 MCG TABS (NORGESTIM-ETH ESTRAD TRIPHASIC) one by mouth daily per package instructions; disp 1 month supply  #1 x 11   Entered and Authorized by:   Myrtie Soman  MD   Signed by:   Myrtie Soman  MD on 08/26/2009   Method used:   Electronically to        [facility] (retail)       [address]       [place], [place]  [id number]       Ph: [phone]       Fax: [phone]   RxID:   [id number] FLEXERIL 5 MG TABS (CYCLOBENZAPRINE HCL) 1-2 by mouth every 8 hours as needed for back pain/spasm  #30 x 0   Entered and Authorized by:   Myrtie Soman  MD   Signed by:   Myrtie Soman  MD on 08/26/2009   Method used:   Electronically to        [facility] [address] [phone]* (retail)       [address] [place], [place]  [address]       Ph: [phone] or [phone]       Fax: [phone]   RxID:   [id number]   Laboratory Results   Urine Tests  Date/Time Received: August 26, 2009 11:07 AM  Date/Time Reported: August 26, 2009 11:14 AM   Routine Urinalysis   Color: yellow Appearance: Clear Glucose: negative   (Normal Range:  Negative) Bilirubin: negative   (Normal Range: Negative) Ketone: negative   (Normal Range: Negative) Spec. Gravity: 1.025   (Normal Range: 1.003-1.035) Blood: negative   (Normal Range: Negative) pH: 6.0   (Normal Range: 5.0-8.0) Protein: negative   (Normal Range:  Negative) Urobilinogen: 0.2   (Normal Range: 0-1) Nitrite: negative   (Normal Range: Negative) Leukocyte Esterace: negative   (Normal Range: Negative)    Urine HCG: negative Comments: ...........test performed by...........Marland KitchenTerese Door, CMA

## 2010-05-30 NOTE — Letter (Signed)
Summary: Out of Work  [facility]  [address]   [place], [place] [address]   Phone: [phone]  Fax: [phone]    February 21, 2010   Employee:  STANLEY HELMUTH    To Whom It May Concern:   For Medical reasons, please excuse the above named employee from work for the following dates:  February 21, 2010  If you need additional information, please feel free to contact our office.         Sincerely,    Renold Don MD

## 2010-05-30 NOTE — Assessment & Plan Note (Signed)
Summary: ob visit/eo   Vital Signs:  Patient profile:   20 year old female Weight:      255 pounds Temp:     98.4 degrees F oral Pulse rate:   75 / minute Pulse rhythm:   regular BP sitting:   128 / 79  (left arm) Cuff size:   large  Vitals Entered By: Loralee Pacas CMA (February 21, 2010 3:57 PM)  Serial Vital Signs/Assessments:  Time      Position  BP       Pulse  Resp  Temp     By                     129/81                         Renold Don MD   Primary Care Provider:  Angelena Sole MD   History of Present Illness: CC:  nausea  1.  Nausea:  Still with nausea.  Being full is a trigger.  Still throwing up every morning.  Most she has thrown up in a day is 3-4 times. Takes Zofran only intermittently because she doesnt like to take medicine.  Non-bloody and non-bilious vomiting.    2.  RUQ pain:  This is new for her.  States that pain is sharp, stabbing pain under Right rib. Present for 1-2 weeks, she's not sure.  Not alleviated by anything, constant in nature.    no headaches, vision changes, chest pain, dyspnea, nausea/vomiting, changes in bowel habits, lower extremity edema     Habits & Providers  Alcohol-Tobacco-Diet     Cigarette Packs/Day: n/a  Current Problems (verified): 1)  Ruq Pain  (ICD-789.01) 2)  Pregnancy, Multigravida  (ICD-V22.1) 3)  Vaginal Discharge  (ICD-623.5) 4)  Screening For Malignant Neoplasm of The Cervix  (ICD-V76.2) 5)  Sore Throat  (ICD-462) 6)  Morning Sickness  (ICD-643.00) 7)  Back Pain, Thoracic Region  (ICD-724.1) 8)  Abdominal Pain Other Specified Site  (ICD-789.09) 9)  Abscess  (ICD-682.9) 10)  Obesity, Nos  (ICD-278.00)  Current Medications (verified): 1)  Zofran 4 Mg Tabs (Ondansetron Hcl) .Marland Kitchen.. 1 By Mouth Q6 Hrs As Needed Nausea 2)  Ranitidine Hcl 150 Mg Caps (Ranitidine Hcl) .... Take 1 Pill Two Times A Day For Heartburn. 3)  Flintstones Complete 60 Mg Chew (Pediatric Multivit-Minerals-C)  Allergies (verified): No Known  Drug Allergies  Past History:  Past medical, surgical, family and social histories (including risk factors) reviewed, and no changes noted (except as noted below).  Past Medical History: Reviewed history from 01/14/2008 and no changes required. chronic low back pain h/o `hairline fracture` of L hip playing bball 06 obesity   Past Surgical History: Reviewed history from 01/08/2007 and no changes required. none  Family History: Reviewed history from 06/03/2007 and no changes required. DM type 2 in all grandparents Father with `heart trouble` and ?TIA in 62s - `he won`t talk about it` Half brothers with asthma, bipolar, and ADHD Mother healthy HTN- grandma  Social History: Reviewed history from 12/27/2009 and no changes required. Lives with toddler son. Currently works at [employer]. Non-smoker.   Physical Exam  General:  Vital signs reviewed. Well-developed, well-nourished patient in NAD.  Awake and cooperative Msk:  gravid, fundal height and FHTs appropriate for gestational age, bowel sounds present in all four quadrants.   Some mild TTP along RUQ, Murphy's sign negative.      Impression & Recommendations:  Problem # 1:  PREGNANCY, MULTIGRAVIDA (ICD-V22.1) 20 yo G3P1011 at 15.6 weeks via 2nd trimester [place], unsure LMP.   EDC: 08/10/10 Prenatal labs: Hgb 13.4/Hct 41.3, Platelets 347, O+/Ab neg, [place] Neg, RPR NR, HIV NR, Rubella Imm Urince cx: insignificant growth without GBS   Patient doing well.  This is my first time meeting her, very pleasant lady.  Somewhat confused by previous fundal height measurements, only 14 cm in clinic today.  FHTs good.  Assigned new EDC via 13 week [place].  To follow up in 4 weeks with me.   Orders: Urinalysis-[facility] (00000) Glucose 1 hr-[facility] (82950) Medicaid OB visit - [facility] (16109) Comp Met-[facility] (60454-09811) Ultrasound (Ultrasound)  Problem # 2:  RUQ PAIN (ICD-789.01) Obtain CMET today to assess liver function and UA to assess for protein.  Blood  pressure x 2 showed systolic of 129.  Will follow closely.   Orders: Comp Met-[facility] (91478-29562)  Problem # 3:  MORNING SICKNESS (ICD-643.00) Assessment: Improved Slightly improved, down from two times a day vomiting to once daily.  As per HPI not taking Zofran consistently.  Able to tolerate nausea.  As this is extending into 2nd trimester, will consider this being hyperemesis gravidarum versus simple morning sickness.  Weights good, tolerating by mouth fluids and food.  Will follow.    Complete Medication List: 1)  Zofran 4 Mg Tabs (Ondansetron hcl) .Marland Kitchen.. 1 by mouth q6 hrs as needed nausea 2)  Ranitidine Hcl 150 Mg Caps (Ranitidine hcl) .... Take 1 pill two times a day for heartburn. 3)  Flintstones Complete 60 Mg Chew (Pediatric multivit-minerals-c)  Patient Instructions: 1)  We will get you set up for an ultrasound for dates and sex of the baby 2)  Schedule a follow up appt to see me in 4 weeks. 3)  We will check a blood test today and let you know the results.   4)  It was good to meet you today!   Orders Added: 1)  Urinalysis-[facility] [00000] 2)  Glucose 1 hr-[facility] [82950] 3)  Medicaid OB visit - [facility] [13086] 4)  Comp Met-[facility] [57846-96295] 5)  Ultrasound [Ultrasound]     Flowsheet View for Follow-up Visit    Estimated weeks of       gestation:     15 6/7    Weight:     255    Blood pressure:   128 / 79    Urine protein:       trace    Urine glucose:    negative    Urine nitrite:     negative    Hx headache?     No    Nausea/vomiting?   vom1/d    Edema?     0    Bleeding?     no    Leakage/discharge?   no    Fetal activity:       no    Labor symptoms?   no    Fundal height:      14    FHR:       135    Taking Vitamins?   Y    Smoking PPD:   n/a    Comment:     New EDC assinged at today's visit.      Next visit:     4 wk    Resident:     Iantha Fallen Initial Intake Information    Positive HCG by: Urgent Care    Race: White  Marital status: Single    Occupation:  Public relations account executive    Type of work: Copywriter, advertising ed    Education (last grade completed): freshman in college    Number of children at home: 1    [facility]    Newborn's physician: shaw  FOB Information    Husband/Father of baby: Revonda Humphrey    FOB occupation [employer]    Phone: [phone]    FOB Comments: Very involved.   Menstrual History    LMP (date): 11/02/2009    Best Working EDC: 08/10/2010    LMP - Character: light    Menarche: 11 years    Menses interval: 30 days    Menstrual flow 7 days    On BCP's at conception: no  Prenatal Visit EDC Confirmation:    New working [facility]: 08/10/2010 Ultrasound Dating Information:    First U/S on 02/02/2010   Gest age: 38   EDC: 08/10/2010.  Laboratory Results   Urine Tests  Date/Time Received: February 21, 2010 4:16 PM  Date/Time Reported: February 21, 2010 5:02 PM   Routine Urinalysis   Color: yellow Appearance: Clear Glucose: negative   (Normal Range: Negative) Bilirubin: small;  reflex ictotest = negative   (Normal Range: Negative) Ketone: small (15)   (Normal Range: Negative) Spec. Gravity: >=1.030   (Normal Range: 1.003-1.035) Blood: negative   (Normal Range: Negative) pH: 6.0   (Normal Range: 5.0-8.0) Protein: trace   (Normal Range: Negative) Urobilinogen: 4.0   (Normal Range: 0-1) Nitrite: negative   (Normal Range: Negative) Leukocyte Esterace: small   (Normal Range: Negative)  Urine Microscopic WBC/HPF: 10-20 Bacteria/HPF: 3+  rods and cocci Epithelial/HPF: >20  with few clue cells    Comments: ...............test performed by......Marland KitchenBonnie A. [place], MLS (ASCP)cm      Appended Document: ob visit/eo Chart reviewed and agree with Dr. Tyson Alias assessment and plan. To consider RUQ U/S to evaluate for possible gallbladder disease; also consider CBC to document platelet count.

## 2010-05-30 NOTE — Assessment & Plan Note (Signed)
Summary: preg & LLQ pain/[place]/[facility]   Vital Signs:  Patient profile:   20 year old female Height:      67.5 inches Weight:      260 pounds Temp:     97.8 degrees F oral BP sitting:   126 / 68  (right arm) Cuff size:   large  Vitals Entered By: Tessie Fass CMA (December 27, 2009 9:55 AM) CC: abdominal painand nausea Pain Assessment Patient in pain? yes     Location: abdomen Intensity: 10   Primary Care Provider:  Angelena Sole MD  CC:  abdominal painand nausea.  History of Present Illness: Nausa and mild abdominal pain. Duration of 1 week.  Is around [redacted] weeks pregnant. No vomiting. Becomes very nauseated with especially in the morning and when smelling strong odors. Is able to drink liquids but is having toruble eating much in the past 2 days. Also notes some mild 2/10 Bl flank abd pain. She isnt much bothered by it. She denies any crampy or squeezing sensations. She thinks they are hungar pains. She also notes some diarrhea over the padt 2 days. Non bloody and a few times a day. She notes that her son had diarrhea for two days a few days ago.  No fever, chills or vaginal bleeding.  Overall she mostly notes nausea but is eating and feeling ok.   Current Problems (verified): 1)  Morning Sickness  (ICD-643.00) 2)  Back Pain, Thoracic Region  (ICD-724.1) 3)  Abdominal Pain Other Specified Site  (ICD-789.09) 4)  Abscess  (ICD-682.9) 5)  Obesity, Nos  (ICD-278.00)  Current Medications (verified): 1)  Zofran 4 Mg Tabs (Ondansetron Hcl) .Marland Kitchen.. 1 By Mouth Q6 Hrs As Needed Nausea 2)  Ranitidine Hcl 150 Mg Caps (Ranitidine Hcl) .... Take 1 Pill Two Times A Day For Heartburn. 3)  Flintstones Complete 60 Mg Chew (Pediatric Multivit-Minerals-C)  Allergies (verified): No Known Drug Allergies  Past History:  Past Medical History: Last updated: 01/14/2008 chronic low back pain h/o `hairline fracture` of L hip playing bball 06 obesity   Family History: Last updated:  06/03/2007 DM type 2 in all grandparents Father with `heart trouble` and ?TIA in 98s - `he won`t talk about it` Half brothers with asthma, bipolar, and ADHD Mother healthy HTN- grandma  Social History: Last updated: 12/27/2009 Lives with toddler son. Currently works at [employer]. Non-smoker.   Risk Factors: Smoking Status: never (08/26/2009) Packs/Day: n/a (09/02/2008)  Social History: Lives with toddler son. Currently works at [employer]. Non-smoker.   Review of Systems       The patient complains of anorexia.  The patient denies fever, weight loss, weight gain, vision loss, syncope, dyspnea on exertion, headaches, hemoptysis, melena, muscle weakness, and difficulty walking.    Physical Exam  General:  Vs noted.  Obese woman in NAD Mouth:  MMM Lungs:  Normal respiratory effort, chest expands symmetrically. Lungs are clear to auscultation, no crackles or wheezes. Heart:  Normal rate and regular rhythm. S1 and S2 normal without gallop, murmur, click, rub or other extra sounds. Abdomen:  Bowel sounds positive,abdomen soft and non-tender without masses, organomegaly or hernias noted. Extremities:  non edemetus and warm and well perfused Cervical Nodes:  No lymphadenopathy noted   Impression & Recommendations:  Problem # 1:  MORNING SICKNESS (ICD-643.00) Assessment New  This is classical for morning sickness in a [redacted] week pregnant woman.  DDX also includes gastroenteritis.  Plan to provide antiemetics and antiacid agents for heart burn as well.  Will follow up with PCP. No red flags. These were also reviewed with pt. See instructions.   Orders: [facility]- Est Level  3 (16109)  Complete Medication List: 1)  Zofran 4 Mg Tabs (Ondansetron hcl) .Marland Kitchen.. 1 by mouth q6 hrs as needed nausea 2)  Ranitidine Hcl 150 Mg Caps (Ranitidine hcl) .... Take 1 pill two times a day for heartburn. 3)  Flintstones Complete 60 Mg Chew (Pediatric multivit-minerals-c)  Patient Instructions: 1)  Thank you  for seeing me today. 2)  Take the zofran for nausea as needed and the zantac for heartburn twice a day as needed.  3)  Let us know if the pain become really bad, or you have bleeding or contractions or vomiting so that you cant keep food down and get a high fever. Or for other concerns.  Prescriptions: RANITIDINE HCL 150 MG CAPS (RANITIDINE HCL) take 1 pill two times a day for heartburn.  #60 x 1   Entered and Authorized by:   Clementeen Graham MD   Signed by:   Clementeen Graham MD on 12/27/2009   Method used:   Electronically to        [facility] [address] (retail)       [address]       [place], [place]  [id number]       Ph: [phone]       Fax: [phone]   RxID:   [id number] ZOFRAN 4 MG TABS (ONDANSETRON HCL) 1 by mouth q6 hrs as needed nausea  #30 x 0   Entered and Authorized by:   Clementeen Graham MD   Signed by:   Clementeen Graham MD on 12/27/2009   Method used:   Electronically to        [facility] [address] (retail)       [address]       [place], [place]  [id number]       Ph: [phone]       Fax: [phone]   RxID:   [id number]    Flowsheet View for Follow-up Visit    Weight:     260    Blood pressure:   126 / 68

## 2010-05-30 NOTE — Assessment & Plan Note (Signed)
Summary: ob/kh   Vital Signs:  Patient profile:   20 year old female Height:      67.5 inches Weight:      253 pounds BMI:     39.18 Temp:     98.4 degrees F oral Pulse rate:   132 / minute BP sitting:   126 / 79  (left arm) Cuff size:   large  Vitals Entered By: Jimmy Footman, CMA (March 20, 2010 1:47 PM) CC: OB 19    4/7 Is Patient Diabetic? No Pain Assessment Patient in pain? no        CC:  OB 19    4/7.  Habits & Providers  Alcohol-Tobacco-Diet     Tobacco Status: never     Cigarette Packs/Day: n/a  Current Problems (verified): 1)  Ruq Pain  (ICD-789.01) 2)  Pregnancy, Multigravida  (ICD-V22.1) 3)  Vaginal Discharge  (ICD-623.5) 4)  Screening For Malignant Neoplasm of The Cervix  (ICD-V76.2) 5)  Sore Throat  (ICD-462) 6)  Morning Sickness  (ICD-643.00) 7)  Back Pain, Thoracic Region  (ICD-724.1) 8)  Abdominal Pain Other Specified Site  (ICD-789.09) 9)  Abscess  (ICD-682.9) 10)  Obesity, Nos  (ICD-278.00)  Current Medications (verified): 1)  Zofran 4 Mg Tabs (Ondansetron Hcl) .Marland Kitchen.. 1 By Mouth Q6 Hrs As Needed Nausea 2)  Ranitidine Hcl 150 Mg Caps (Ranitidine Hcl) .... Take 1 Pill Two Times A Day For Heartburn. 3)  Flintstones Complete 60 Mg Chew (Pediatric Multivit-Minerals-C)  Allergies (verified): No Known Drug Allergies  Past History:  Past medical, surgical, family and social histories (including risk factors) reviewed, and no changes noted (except as noted below).  Past Medical History: Reviewed history from 01/14/2008 and no changes required. chronic low back pain h/o `hairline fracture` of L hip playing bball 06 obesity   Past Surgical History: Reviewed history from 01/08/2007 and no changes required. none  Family History: Reviewed history from 06/03/2007 and no changes required. DM type 2 in all grandparents Father with `heart trouble` and ?TIA in 40s - `he won`t talk about it` Half brothers with asthma, bipolar, and ADHD Mother  healthy HTN- grandma  Social History: Reviewed history from 12/27/2009 and no changes required. Lives with toddler son. Currently works at [employer]. Non-smoker.   Review of Systems       no headaches, vision changes, chest pain, dyspnea, nausea/vomiting, changes in bowel habits, lower extremity edema   Physical Exam  General:  Vital signs reviewed. Well-developed, well-nourished patient in NAD.  Awake and cooperative  Neck:  no goiter noted Abdomen:  gravid, fundal height and FHTs appropriate for gestational age, bowel sounds present in all four quadrants  Extremities:  no pedal edema No erythema    Impression & Recommendations:  Problem # 1:  PREGNANCY, MULTIGRAVIDA (ICD-V22.1) 20 yo G3P1011 at 19 weeks via 2nd trimester [place], unsure LMP.   EDC: 08/10/10 Prenatal labs: Hgb 13.4/Hct 41.3, Platelets 347, O+/Ab neg, [place] Neg, RPR NR, HIV NR, Rubella Imm Urince cx: insignificant growth without GBS   Patient doing well.  Fundal heights, fetal heart tones good. Nausea and morning sickness have resolved.  No other complaints today.  Hx/o RUQ pain, CMET obtained last visit was WNL, RUQ resolved, BP's good.  To follow up in 4 weeks with me.   Orders: Other OB visit- [facility] ([facility])  Complete Medication List: 1)  Zofran 4 Mg Tabs (Ondansetron hcl) .Marland Kitchen.. 1 by mouth q6 hrs as needed nausea 2)  Ranitidine Hcl 150 Mg Caps (  Ranitidine hcl) .... Take 1 pill two times a day for heartburn. 3)  Flintstones Complete 60 Mg Chew (Pediatric multivit-minerals-c)  Patient Instructions: 1)  Make an appt to be seen at the [facility] clinic in 4 weeks.   2)  If you have any vaginal bleeding, extreme pain, or other concerns call the clinic or go to the MAU at [facility].    3)  It was good to see you today!   Orders Added: 1)  Other OB visit- [facility] [[facility]]     Flowsheet View for Follow-up Visit    Estimated weeks of       gestation:     19 4/7    Weight:     253    Blood pressure:   126 / 79    Hx headache?      +    Nausea/vomiting?   No    Edema?     0    Bleeding?     no    Leakage/discharge?   no    Fetal activity:       no    Labor symptoms?   no    Fundal height:      19    FHR:       142    Taking Vitamins?   Y    Smoking PPD:   n/a    Next visit:     4 wk    Resident:     Iantha Fallen Initial Intake Information    Positive HCG by: Urgent Care    Race: White    Marital status: Single    Occupation: Conservation officer, [employer] for [employer]    Type of work: Copywriter, advertising ed    Education (last grade completed): freshman in college    Number of children at home: 1    [facility]    Newborn's physician: shaw  FOB Information    Husband/Father of baby: Revonda Humphrey    FOB occupation [employer]    Phone: [phone]    FOB Comments: Very involved.   Menstrual History    LMP (date): 11/02/2009    LMP - Character: light    Menarche: 11 years    Menses interval: 30 days    Menstrual flow 7 days    On BCP's at conception: no

## 2010-05-30 NOTE — Assessment & Plan Note (Signed)
Summary: boil on leg/[place]/[facility]   Vital Signs:  Patient profile:   20 year old female Height:      67.5 inches Weight:      251 pounds BMI:     38.87 BSA:     2.24 Temp:     98.2 degrees F Pulse rate:   76 / minute BP sitting:   116 / 83  Vitals Entered By: Jone Baseman CMA (May 03, 2009 11:21 AM) CC: boil on leg x 3 days Is Patient Diabetic? No Pain Assessment Patient in pain? yes     Location: right leg Intensity: 8   CC:  boil on leg x 3 days.  History of Present Illness: boil - R leg x 3 days.  has been getting worse.  very painful.  no draiage.  had 1 boil just proximal to it in the past that went away on its own.  no trauma to the area.  prior to this no history of boils.  otherwise no fevers, chills.  hasn't been doing anything at home to help out.   Habits & Providers  Alcohol-Tobacco-Diet     Tobacco Status: never  Current Medications (verified): 1)  Doxycycline Hyclate 100 Mg Tabs (Doxycycline Hyclate) .Marland Kitchen.. 1 By Mouth Two Times A Day For 10 Days For Leg Infection 2)  Naproxen 500 Mg Tabs (Naproxen) .Marland Kitchen.. 1 By Mouth Two Times A Day As Needed Pain. 3)  Mirena 20 Mcg/24hr Iud (Levonorgestrel) .... Placed 12/29/2008  Allergies (verified): No Known Drug Allergies  Past History:  Past medical, surgical, family and social histories (including risk factors) reviewed for relevance to current acute and chronic problems.  Past Medical History: Reviewed history from 01/14/2008 and no changes required. chronic low back pain h/o `hairline fracture` of L hip playing bball 06 obesity   Past Surgical History: Reviewed history from 01/08/2007 and no changes required. none  Family History: Reviewed history from 06/03/2007 and no changes required. DM type 2 in all grandparents Father with `heart trouble` and ?TIA in 69s - `he won`t talk about it` Half brothers with asthma, bipolar, and ADHD Mother healthy HTN- grandma  Social History: Reviewed history from  06/03/2007 and no changes required. Lives at home with mother and 2 half brothers. Has total of 5 half brothers, 3 from mom and 2 from dad. Gets along well with them. Parents do not live together but are amiable and she has a good relationship with both. Gma (mom's mom) very involved. Mom smokes in the home. 1 dog. Devyne likes basketball and dancing. A/B kid. Denies tobacco, EtOH, drugs. First sex In march 2007 with current boyfriend and always uses condoms. 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 Smoking Status:  never  Review of Systems       per HPI  Physical Exam  General:  Well-developed,well-nourished,in no acute distress; alert,appropriate and cooperative throughout examination Skin:  R lower shin with indurated, erythematous, warm area approxiamtely 4cm in diameter.  appears to be coming to a head but no area of fluctuance able to drain at this time.  healed scar just proximal to this wthout signs of infection.  able to wiggle toes distally.  very tender to touch over boil.    Impression & Recommendations:  Problem # 1:  ABSCESS (ICD-682.9) Assessment New  no area to drain at this point. advised heat. did start abx.  f/u once comes to head or if  worsens.    The following medications were removed from the medication list:    Doxycycline Hyclate 100 Mg Tabs (Doxycycline hyclate) .Marland Kitchen..Marland Kitchen Two times a day for 7 days Her updated medication list for this problem includes:    Doxycycline Hyclate 100 Mg Tabs (Doxycycline hyclate) .Marland Kitchen... 1 by mouth two times a day for 10 days for leg infection  Orders: [facility]- Est Level  3 (29528)  Complete Medication List: 1)  Doxycycline Hyclate 100 Mg Tabs (Doxycycline hyclate) .Marland Kitchen.. 1 by mouth two times a day for 10 days for leg infection 2)  Naproxen 500 Mg Tabs (Naproxen) .Marland Kitchen.. 1 by mouth two times a day as needed pain. 3)  Mirena 20 Mcg/24hr Iud (Levonorgestrel) .... Placed  12/29/2008  Patient Instructions: 1)  Apply heat to the boil on your leg at least 3 times daily. 2)  Take the antibiotic prescribed as well as the pain  medicine as needed.  you can also take tylenol with it at home. 3)  If it comes to a "head" call us and we will get you in to open it up and get it feeling better. Prescriptions: NAPROXEN 500 MG TABS (NAPROXEN) 1 by mouth two times a day as needed pain.  #60 x 0   Entered and Authorized by:   Ancil Boozer  MD   Signed by:   Ancil Boozer  MD on 05/03/2009   Method used:   Electronically to        [facility] [phone]* (retail)       [address] [place], [place]  [address]       Ph: [phone] or [phone]       Fax: [phone]   RxID:   [id number] DOXYCYCLINE HYCLATE 100 MG TABS (DOXYCYCLINE HYCLATE) 1 by mouth two times a day for 10 days for leg infection  #20 x 0   Entered and Authorized by:   Ancil Boozer  MD   Signed by:   Ancil Boozer  MD on 05/03/2009   Method used:   Electronically to        [facility] [phone]* (retail)       [address] [place], [place]  [address]       Ph: [phone] or [phone]       Fax: [phone]   RxID:   [id number]

## 2010-05-30 NOTE — Letter (Signed)
Summary: Generic Letter  [facility]  [address]   [place], [place] [address]   Phone: [phone]  Fax: [phone]    02/01/2010  Kendra [name] [address] [place], [place]  [address]  Dear Ms. Aoun,   Your PAP smear was normal     Sincerely,   Angelena Sole MD  Appended Document: Generic Letter mailed.

## 2010-05-30 NOTE — Assessment & Plan Note (Signed)
Summary: flu symptoms/[place]/ saunders   Vital Signs:  Patient profile:   20 year old female Height:      67.5 inches Weight:      262 pounds BMI:     40.58 BSA:     2.28 Temp:     98.3 degrees F Pulse rate:   104 / minute BP sitting:   119 / 79  Vitals Entered By: Jone Baseman CMA (June 24, 2009 2:38 PM) CC: FLU x 3 days Is Patient Diabetic? No Pain Assessment Patient in pain? yes     Location: head and stomach Intensity: 8   CC:  FLU x 3 days.  History of Present Illness: 1. flu symptoms Reports HA for the past 3 days. Nausea and vomiting  and diarrhea for the past 1 day (non-bilious non-bloody). + cough and runny nose. Side, back pain also for the last 3 days. took some ibuprofen which helped only a little.   GM in the hospital with the flu. Aunt recently had the flu. Son with a URI recently.   ROS: No chest pain, no SOB. No dysuria. Has been able to eat and drink but decreased appetite. No sore throat.   Habits & Providers  Alcohol-Tobacco-Diet     Tobacco Status: never  Current Medications (verified): 1)  None  Allergies (verified): No Known Drug Allergies  Social History: Lives with 26 month old son. Currently works at [employer]. Non-smoker.   Physical Exam  Additional Exam:  General:  Vital signs reviewed -- obese, slightly tachy but otherwise normal Alert, appropriate; somewhat ill-appearing HEENT: EOMI, sclerae clear, OP pink and moist, no erythema or exudate;  Neck: mild cervical LAD Lungs:  work of breathing unlabored, clear to auscultation bilaterally; no wheezes, rales, or ronchi; good air movement throughout Abd: no CVAT Heart:  regular rate and rhythm, no murmurs; normal s1/s2 Pulses:  DP and radial pulses 2+ bilaterally  Extremities:  no cyanosis, clubbing, or edema Neurologic:  alert and oriented. speech normal.    Impression & Recommendations:  Problem # 1:  INFLUENZA LIKE ILLNESS (ICD-487.1)  on the edge of treatment window --  HA x 3 days, systemic symptoms x 1 day. Given 33 month old at home will start antiviral treatment. Suportive care otherwise.   Orders: [facility]- Est Level  3 ([address])  Complete Medication List: 1)  Ibuprofen 600 Mg Tabs (Ibuprofen) .... One by mouth every 6-8 hours as needed 2)  Tamiflu 75 Mg Caps (Oseltamivir phosphate) .... One by mouth two times a day for 5 days 3)  Pseudoephedrine Hcl 60 Mg Tabs (Pseudoephedrine hcl) .... One by mouth every 6 hours as needed for congestion  Patient Instructions: 1)  You probably have the flu. It's very important that your wash your hands all the time, especially after coughing. 2)  take Tamiflu two times a day for 5 days. 3)  Take tylenol and ibuprofen for fever and body aches. 4)  Return if you have trouble breathing, can't control your fever, or have other concerning symptoms. 5)  Make sure you drink plenty of liquids. Small frequent sips of clear liquids are best.  Prescriptions: PSEUDOEPHEDRINE HCL 60 MG TABS (PSEUDOEPHEDRINE HCL) one by mouth every 6 hours as needed for congestion  #30 x 0   Entered and Authorized by:   Myrtie Soman  MD   Signed by:   Myrtie Soman  MD on 06/24/2009   Method used:   Electronically to        [facility] (retail)       [address]       [place], [place]  [id number]       Ph: [phone]       Fax: [phone]   RxID:   [id number] TAMIFLU 75 MG CAPS (OSELTAMIVIR PHOSPHATE) one by mouth two times a day for 5 days  #10 x 0   Entered and Authorized by:   Myrtie Soman  MD   Signed by:   Myrtie Soman  MD on 06/24/2009   Method used:   Electronically to        [facility] (retail)       [address]       [place], [place]  [id number]       Ph: [phone]       Fax: [phone]   RxID:   [id number] IBUPROFEN 600 MG TABS (IBUPROFEN) one by mouth every 6-8 hours as needed  #30 x 0   Entered and Authorized by:   Myrtie Soman  MD   Signed by:    Myrtie Soman  MD on 06/24/2009   Method used:   Electronically to        [facility] (retail)       [address]       [place], [place]  [id number]       Ph: [phone]       Fax: [phone]   RxID:   [id number]    Prevention & Chronic Care Immunizations   Influenza vaccine: Fluvax 3+  (01/14/2008)   Influenza vaccine due: 01/13/2009    Tetanus booster: Not documented    Pneumococcal vaccine: Not documented  Other Screening   Pap smear: NEGATIVE FOR INTRAEPITHELIAL LESIONS OR MALIGNANCY.  (01/30/2008)   Pap smear due: 01/29/2009   Smoking status: never  (06/24/2009)

## 2010-05-30 NOTE — Letter (Signed)
Summary: Out of Work  [facility]  [address]   [place], [place] [address]   Phone: [phone]  Fax: [phone]    May 03, 2009   Employee:  AUGUSTINA BRADDOCK    To Whom It May Concern:   For Medical reasons, please excuse the above named employee from work for the following dates:  Start:   05/03/09   If you need additional information, please feel free to contact our office.         Sincerely,    Ancil Boozer  MD

## 2010-05-30 NOTE — Assessment & Plan Note (Signed)
Summary: sore throat,df   Vital Signs:  Patient profile:   20 year old female Height:      67.5 inches Weight:      256.8 pounds BMI:     39.77 Temp:     97.4 degrees F oral Pulse rate:   74 / minute BP sitting:   104 / 73  (left arm) Cuff size:   large  Vitals Entered By: Garen Grams LPN (December 30, 2009 4:09 PM) CC: sore throat x 2-3 days Is Patient Diabetic? No Pain Assessment Patient in pain? no        Primary Nicolas Banh:  Angelena Sole MD  CC:  sore throat x 2-3 days.  History of Present Illness: Sore throat for past 3 days. Recently diagnosed with pregnancy morning sickness vs. gastroenteritis. Vomitted several times yesterday and once today. Diarrhea improved. Is keeping down fluids, some throat pain with swallowing. Nonproductive cough. Had single temp of 100.9 yesterday.  Notably her one-year-old son recently had diarrhea as well.    Habits & Providers  Alcohol-Tobacco-Diet     Tobacco Status: never  Allergies: No Known Drug Allergies  Past History:  Social History: Last updated: 12/27/2009 Lives with toddler son. Currently works at [employer]. Non-smoker.   Risk Factors: Smoking Status: never (12/30/2009) Packs/Day: n/a (09/02/2008)  Review of Systems  The patient denies anorexia, weight loss, weight gain, vision loss, decreased hearing, prolonged cough, headaches, and hemoptysis.    Physical Exam  General:  Well-developed,well-nourished,in no acute distress; alert,appropriate and cooperative throughout examination Head:  normocephalic and atraumatic.   Eyes:  No corneal or conjunctival inflammation noted. EOMI. Perrla. Vision grossly normal. Nose:  External nasal examination shows no deformity or inflammation. Nasal mucosa are pink and moist without lesions or exudates. Mouth:  Oral mucosa and oropharynx without lesions or exudates. Mild erythematous injection in pharyngeal mucosa. Teeth in good repair. Neck:  No LAD. Lungs:  Normal  respiratory effort, chest expands symmetrically. Lungs are clear to auscultation, no crackles or wheezes. Heart:  Normal rate and regular rhythm. S1 and S2 normal without gallop, murmur, click, rub or other extra sounds. Abdomen:  Bowel sounds hyperactive,abdomen soft and non-tender without masses, organomegaly or hernias noted.   Impression & Recommendations:  Problem # 1:  SORE THROAT (ICD-462) Rapid strep negative. Afebrile today without signs of bacterial infection.  Likely 2/2 viral illness vs. emesis-induced irritation. Will treat symptomatically with tylenol or chloraseptic spray if desired and fluids. Also may use salt water gargles. F/u if she does not improve in next week.   Orders: Rapid Strep-[facility] ([address]) [facility]- Est Level  3 ([address])  Complete Medication List: 1)  Zofran 4 Mg Tabs (Ondansetron hcl) .Marland Kitchen.. 1 by mouth q6 hrs as needed nausea 2)  Ranitidine Hcl 150 Mg Caps (Ranitidine hcl) .... Take 1 pill two times a day for heartburn. 3)  Flintstones Complete 60 Mg Chew (Pediatric multivit-minerals-c)  Patient Instructions: 1)  Nice to meet you. 2)  Most likely you have a virus, which cannot be treated with antibiotics. 3)  Please drink lots of fluids. 4)  You may take tylenol or use chloraseptic spray for throat pain. 5)  You may gargle with salt water for throat pain.  Laboratory Results  Date/Time Received: December 30, 2009 4:13 PM  Date/Time Reported: December 30, 2009 4:26 PM   Other Tests  Rapid Strep: negative Comments: ...........test performed by...........Marland KitchenTerese Door, CMA

## 2010-05-30 NOTE — Progress Notes (Signed)
Summary: triage  Phone Note Call from Patient Call back at Home Phone [phone]   Caller: Patient Summary of Call: Pt says head has been hurting for 3 days and wondering if she can be seen today. Initial call taken by: Clydell Hakim,  June 24, 2009 2:10 PM  Follow-up for Phone Call        has been taking advil x 3 days pain on r side. nausea, diarrhea. body aches, stomach pain. states she is in the parking lot. asked her to come in. appt made in workin. advised there may be a wait Follow-up by: Golden Circle RN,  June 24, 2009 2:26 PM

## 2010-05-30 NOTE — Miscellaneous (Signed)
Summary: triage call  Clinical Lists Changes received call form patient stating she is  pregnant and she just went to bathroom and saw some blood  about the size of a penny she states along with what appeared to be discharge.  she is currently at work. she is also having some cramping. advised patient to go to [facility] now for evaluation. she ask that I speak with her manager and I told manager " from information patient is giving me I advised her to go to [facility] for evaluation. " Theresia Lo RN  January 18, 2010 4:12 PM

## 2010-05-30 NOTE — Miscellaneous (Signed)
Summary: preg & LLQ pain  Clinical Lists Changes states she has had LLQ pain for a while but much worse as of yesterday. states she is 1 month pregnant. she went to UC for c/o back pain a few days ago & found out she was pregnant. asked her to come in now to be seen. pcp is full (he is seeing her son for c/o fever & diarrhea) placed in work in clinic. pcp not available at this time.Golden Circle RN  December 27, 2009 8:46 AM

## 2010-05-30 NOTE — Miscellaneous (Signed)
Summary: GC/Chlamydia results 01/18/10  Clinical Lists Changes  L-GC/Chlamydia Probe Amp, Genital - STATUS: Final                                            Perform Date: 21Sep11 20:13  Ordered By: Ciro Backer,           Ordered Date: 21Sep11 20:23                                       Last Updated Date: 22Sep11 08:37  Facility: [facility]                                Department: [facility]  Accession #: [id number]                  USN:       [id number]  Findings  Result Name                              Result     Abnl   Normal Range     Units      Perf. Loc.  GC Probe Amp, Genital                    NEGATIVE          NEG    Oversized comment, see footnote  1  Chlamydia Probe Amp, Genital             NEGATIVE          NEG    Oversized comment, see footnote  2  Footnotes  1. (NOTE)     Testing performed using the BD ProbeTec Qx Chlamydia trachomatis and     Neisseria gonorrhea amplified DNA assay.     Performed at:  [facility]     [address]     [place], [place] [address]     [id number]  2. (NOTE)     Testing performed using the BD ProbeTec Qx Chlamydia trachomatis and     Neisseria gonorrhea amplified DNA assay.     Performed at:  [facility]     [address]     [place], [place] [address]     [id number]     Performed at [facility]  Additional Information  HL7 RESULT STATUS : F  External IF Update Timestamp : 2010-01-19:08:33:00.000000

## 2010-05-30 NOTE — Progress Notes (Signed)
Summary: triage  Phone Note Call from Patient Call back at Home Phone [phone]   Caller: Patient Summary of Call: has a boil on her leg and wants to come in. Initial call taken by: De Nurse,  May 03, 2009 10:16 AM  Follow-up for Phone Call        started 2-3 days ago. has hx of them per pt. located above ankle. very painful. to come in now Follow-up by: Golden Circle RN,  May 03, 2009 10:18 AM

## 2010-06-01 NOTE — Letter (Signed)
Summary: Generic Letter  [facility]  [address]   [place], [place] [address]   Phone: [phone]  Fax: [phone]    05/15/2010  Micheal Christopherson  To Whom It May Concern:  Finnleigh Marchetti is a patient of mine receiving Obstetric care.  She is currently pregnant with a due date of 08/10/10.    If you have any questions, please do not hesitate to contact my office.   Sincerely,   Renold Don MD

## 2010-06-01 NOTE — Assessment & Plan Note (Signed)
Summary: ob f/u [facility]   Vital Signs:  Patient profile:   20 year old female Weight:      266.7 pounds Pulse rate:   60 / minute BP sitting:   94 / 61  Vitals Entered By: Garen Grams LPN (April 28, 2010 10:59 AM)  Primary Care Provider:  Angelena Sole MD   History of Present Illness: Lower abdominal pain:  Located mostly on the LLQ.  Comes and goes.  Associated with certain movements.  Denies any current pain.  No vaginal bleeding, leakage of fluid.  endorses good fetal movement  Habits & Providers  Alcohol-Tobacco-Diet     Cigarette Packs/Day: n/a  Current Medications (verified): 1)  Zofran 4 Mg Tabs (Ondansetron Hcl) .Marland Kitchen.. 1 By Mouth Q6 Hrs As Needed Nausea 2)  Ranitidine Hcl 150 Mg Caps (Ranitidine Hcl) .... Take 1 Pill Two Times A Day For Heartburn. 3)  Flintstones Complete 60 Mg Chew (Pediatric Multivit-Minerals-C)  Allergies: No Known Drug Allergies  Past History:  Past Medical History: Reviewed history from 01/14/2008 and no changes required. chronic low back pain h/o `hairline fracture` of L hip playing bball 06 obesity   Social History: Reviewed history from 12/27/2009 and no changes required. Lives with toddler son. Currently works at [employer]. Non-smoker.   Physical Exam  General:  Vital signs reviewed. Well-developed, well-nourished patient in NAD.  Awake and cooperative  Lungs:  normal respiratory effort, normal breath sounds, no crackles, and no wheezes.  normal respiratory effort, normal breath sounds, no crackles, and no wheezes.   Heart:  normal rate, regular rhythm, and no murmur.  normal rate, regular rhythm, and no murmur.   Abdomen:  gravid, fundal height and FHTs appropriate for gestational age, bowel sounds present in all four quadrants.  S/NT/ND.  Extremities:  no pedal edema No erythema    Impression & Recommendations:  Problem # 1:  PREGNANCY, MULTIGRAVIDA (ICD-V22.1) Assessment Unchanged  Sounds like she is having some round  ligament pain.  No red flags.  Conservative care.  Will have her come back in 2 weeks for repeat 1 hour gtt.  Orders: Other OB visit- [facility] ([facility])  Complete Medication List: 1)  Zofran 4 Mg Tabs (Ondansetron hcl) .Marland Kitchen.. 1 by mouth q6 hrs as needed nausea 2)  Ranitidine Hcl 150 Mg Caps (Ranitidine hcl) .... Take 1 pill two times a day for heartburn. 3)  Flintstones Complete 60 Mg Chew (Pediatric multivit-minerals-c)  Patient Instructions: 1)  It was good to see you today 2)  I hope you have a good New Years 3)  The pain that you are describing is most likley round ligament pain and there is not much that we can do for that 4)  Please schedule a follow up appointment with Dr. Gwendolyn Grant in 2 weeks, we will redo the sugar test then   Orders Added: 1)  Other OB visit- [facility] [[facility]]     OB Initial Intake Information    Positive HCG by: Urgent Care    Race: White    Marital status: Single    Occupation: Conservation officer, [employer] for [employer]    Type of work: Copywriter, advertising ed    Education (last grade completed): freshman in college    Number of children at home: 1    [facility]    Newborn's physician: shaw  FOB Information    Husband/Father of baby: Revonda Humphrey    FOB occupation [employer]    Phone: [phone]    FOB  Comments: Very involved.   Menstrual History    LMP (date): 11/02/2009    LMP - Character: light    Menarche: 11 years    Menses interval: 30 days    Menstrual flow 7 days    On BCP's at conception: no   Flowsheet View for Follow-up Visit    Estimated weeks of       gestation:     25 1/7    Weight:     266.7    Blood pressure:   94 / 61    Headache:     No    Nausea/vomiting:   No    Edema:     0    Vaginal bleeding:   no    Vaginal discharge:   no    Fundal height:      27    FHR:       150    Fetal activity:     yes    Labor symptoms:   no    Taking prenatal vits?   Y    Smoking:     n/a    Next visit:     2 wk    Resident:      Lelon Perla

## 2010-06-01 NOTE — Miscellaneous (Signed)
Summary: Consent for sterilization  Consent for sterilization   Imported By: De Nurse 05/15/2010 15:50:43  _____________________________________________________________________  External Attachment:    Type:   Image     Comment:   External Document

## 2010-06-01 NOTE — Assessment & Plan Note (Signed)
Summary: vomitting/pregnant/eo   Vital Signs:  Patient profile:   20 year old female Height:      67.5 inches Weight:      257.2 pounds BMI:     39.83 Temp:     98.7 degrees F oral Pulse rate:   76 / minute BP sitting:   126 / 76  (left arm) Cuff size:   large  Vitals Entered By: Garen Grams LPN (May 10, 2010 2:36 PM) CC: diarrhea, vomiting x 3 days  Is Patient Diabetic? No Pain Assessment Patient in pain? yes     Location: pelvic region   Primary Care Provider:  Angelena Sole MD  CC:  diarrhea and vomiting x 3 days .  History of Present Illness: 1) Diarrhea / emesis: x 3 days. Emesis x 5 today (able to keep down ginger ale this afternoon) - non bloody, non bilious. Diarrhea x 3 today - non bloody. Has not taken anything for symptoms. No known sick contacts or new foods, or recent travel. URI symptoms (productive cough, rhinorrhea) last week, now improved. 26.[redacted] weeks EGA, reports +ve fetal movement.   Denies presyncope, dyspnea, bleeding, contractions, vaginal discharge,    Med rec: prenatal vitamins   Habits & Providers  Alcohol-Tobacco-Diet     Tobacco Status: never     Cigarette Packs/Day: n/a  Allergies (verified): No Known Drug Allergies  Physical Exam  General:  Vital signs reviewed. Well appearing, well hydrated, NAD   Mouth:  moist membranes, w/o erythema or exudate  Lungs:  normal respiratory effort, normal breath sounds, no crackles, and no wheezes.  normal respiratory effort, normal breath sounds, no crackles, and no wheezes.   Heart:  normal rate, regular rhythm, and no murmur.  normal rate, regular rhythm, and no murmur.   Abdomen:  gravid, fundal height and FHTs appropriate for gestational age, bowel sounds present in all four quadrants.  S/NT/ND.  Extremities:  2+ radials  Neurologic:  alert & oriented X3.   Skin:  good turgor and cap refill   Impression & Recommendations:  Problem # 1:  GASTROENTERITIS, VIRAL (ICD-008.8) Assessment  New  Likely viral gastroenteritis. Symptomatic treatment reviewed - Zofran and Imodium to control symptoms. Advised on importance of staying well hydrated. Follow up next week. Red flags that would prompt return to care were reviewed with patient and patient expressed understanding.   Orders: [facility]- Est Level  3 ([address])  Complete Medication List: 1)  Zofran 4 Mg Tabs (Ondansetron hcl) .Marland Kitchen.. 1 by mouth q6 hrs as needed nausea 2)  Ranitidine Hcl 150 Mg Caps (Ranitidine hcl) .... Take 1 pill two times a day for heartburn. 3)  Flintstones Complete 60 Mg Chew (Pediatric multivit-minerals-c) 4)  Imodium A-d 2 Mg Tabs (Loperamide hcl) .... One tab by mouth three times a day as needed for diarrhea  Patient Instructions: 1)  Stay well hydrated - try Gatorade instead of sodas.  2)  If you notice your vomiting gets worse or diarrhea gets worse or you feel that you are not improving before your appointment on Monday, come back in to be seen.  Prescriptions: ZOFRAN 4 MG TABS (ONDANSETRON HCL) 1 by mouth q6 hrs as needed nausea  #30 x 0   Entered and Authorized by:   Bobby Rumpf  MD   Signed by:   Bobby Rumpf  MD on 05/10/2010   Method used:   Electronically to        [facility] [address] [address] (retail)  [address]       [place], [place]  [id number]       Ph: [phone]       Fax: [phone]   RxID:   [id number] IMODIUM A-D 2 MG TABS (LOPERAMIDE HCL) one tab by mouth three times a day as needed for diarrhea  #30 x 1   Entered and Authorized by:   Bobby Rumpf  MD   Signed by:   Bobby Rumpf  MD on 05/10/2010   Method used:   Electronically to        [facility] (retail)       [address]       [place], [place]  [id number]       Ph: [phone]       Fax: [phone]   RxID:   [id number]    Orders Added: 1)  [facility]- Est Level  3 [[address]]     OB Initial Intake Information    Positive HCG by: Urgent Care    Race: White    Marital  status: Single    Occupation: Conservation officer, [employer] for [employer]    Type of work: Copywriter, advertising ed    Education (last grade completed): freshman in college    Number of children at home: 1    [facility]    Newborn's physician: shaw  FOB Information    Husband/Father of baby: Revonda Humphrey    FOB occupation [employer]    Phone: [phone]    FOB Comments: Very involved.   Menstrual History    LMP (date): 11/02/2009    LMP - Character: light    Menarche: 11 years    Menses interval: 30 days    Menstrual flow 7 days    On BCP's at conception: no

## 2010-06-01 NOTE — Progress Notes (Signed)
Summary: head cold and cough  Phone Note Call from Patient Call back at [facility] Phone [phone]   Summary of Call: Pt called with a head cold for the last 10 days and has not been sleeping well due cough.  Pt denies fever but is having a little abdominal pain.  Pt feels very weak and has not been able to eart much.  Pt is 5/12 months pregnant.  Told pt to go to [facility] and be seen if needed, told her otherwise would do tylenol and benedryl but she may have an infection and should be evaluated for possiblle need of antibiotics.  Initial call taken by: Antoine Primas DO,  May 04, 2010 9:26 PM

## 2010-06-01 NOTE — Assessment & Plan Note (Signed)
Summary: fu/mj   Vital Signs:  Patient profile:   20 year old female Height:      67.5 inches Weight:      264 pounds Pulse rate:   100 / minute BP sitting:   117 / 80  Vitals Entered By: Kendra Grams LPN (May 15, 2010 8:38 AM)   Primary Care Provider:  Angelena Sole MD   History of Present Illness: Back and Pelvic pain:  Patient with increasing lower quadrant pain.  Worse on days when she works long hours.  Still with daily vomiting.  States pain is making it difficult to work, has to call in and ask for days off multiple times a week.  This is putting a strain on her employer as they are constantly having to find back up for her.  Taking Tylenol regularly every 6-8 hours without relief.    Habits & Providers  Alcohol-Tobacco-Diet     Cigarette Packs/Day: n/a  Current Medications (verified): 1)  Zofran 4 Mg Tabs (Ondansetron Hcl) .Marland Kitchen.. 1 By Mouth Q6 Hrs As Needed Nausea 2)  Ranitidine Hcl 150 Mg Caps (Ranitidine Hcl) .... Take 1 Pill Two Times A Day For Heartburn. 3)  Flintstones Complete 60 Mg Chew (Pediatric Multivit-Minerals-C) 4)  Imodium A-D 2 Mg Tabs (Loperamide Hcl) .... One Tab By Mouth Three Times A Day As Needed For Diarrhea  Allergies (verified): No Known Drug Allergies  Review of Systems       no headaches, vision changes, chest pain, dyspnea, nausea/vomiting, changes in bowel habits, lower extremity edema   Physical Exam  General:  Vital signs reviewed. Well-developed, well-nourished patient in NAD.  Awake and cooperative  Mouth:  Oral mucosa pink and moist Dentition:  good Abdomen:  gravid, fundal height and FHTs appropriate for gestational age, bowel sounds present in all four quadrants  Msk:  Some pain upon palpation of BL lumbosacral muscles.  No bony tenderness.  No decreased range of motion.   Extremities:  No clubbing, cyanosis, edema, or deformity noted with normal full range of motion of all joints.  Trace LE edema Neurologic:  no focal  deficits.     Impression & Recommendations:  Problem # 1:  PREGNANCY, MULTIGRAVIDA (ICD-V22.1) Assessment Comment Only 26 weeks G3P1011 at [redacted] weeks GA today.  Pt complaining of extensive back pain with pregnancy, having to miss considerable amounts of work due to pain.  See below   Prenatal labs reviewed.   No red flags on labs or by history.   FU 28 weeks, discussed 1 hour glucola and lab work with patient at that time.  Expressed understanding.  Warnings and reasons to return to clinic/MAU discussed with patient.   Orders: Glucose 1 hr-[facility] (82950) Medicaid OB visit - [facility] (40981) CBC-[facility] (19147) HIV-[facility] (82956-21308) RPR-[facility] (65784-69629)  Problem # 2:  BACK PAIN, THORACIC REGION (ICD-724.1) Assessment: Deteriorated Long discussion with patient.  She would prefer to take time off starting Monday.  Has accrued enough time for leave, spoken with employer about this in past.  Will write letter for her to take medical leave from work as she is miserable at work.    Complete Medication List: 1)  Zofran 4 Mg Tabs (Ondansetron hcl) .Marland Kitchen.. 1 by mouth q6 hrs as needed nausea 2)  Ranitidine Hcl 150 Mg Caps (Ranitidine hcl) .... Take 1 pill two times a day for heartburn. 3)  Flintstones Complete 60 Mg Chew (Pediatric multivit-minerals-c) 4)  Imodium A-d 2 Mg Tabs (Loperamide hcl) .... One tab by  mouth three times a day as needed for diarrhea   Orders Added: 1)  Glucose 1 hr-[facility] [82950] 2)  Medicaid OB visit - [facility] [[id number]] 3)  CBC-[facility] [85027] 4)  HIV-[facility] [98119-14782] 5)  RPR-[facility] [95621-30865]     OB Initial Intake Information    Positive HCG by: Urgent Care    Race: White    Marital status: Single    Occupation: Conservation officer, [employer] for [employer]    Type of work: studying special ed    Education (last grade completed): freshman in college    Number of children at home: 1    [facility]    Newborn's physician: shaw  FOB Information    Husband/Father of baby: Revonda Humphrey     FOB occupation [employer]    Phone: [phone]    FOB Comments: Very involved.   Menstrual History    LMP (date): 11/02/2009    LMP - Character: light    Menarche: 11 years    Menses interval: 30 days    Menstrual flow 7 days    On BCP's at conception: no   Flowsheet View for Follow-up Visit    Estimated weeks of       gestation:     27 4/7    Weight:     264    Blood pressure:   117 / 80    Headache:     few    Nausea/vomiting:   vom1/d    Edema:     TrLE    Vaginal bleeding:   no    Vaginal discharge:   no    Fundal height:      26    FHR:       132    Fetal activity:     yes    Labor symptoms:   few ctx    Taking prenatal vits?   Y    Smoking:     n/a    Next visit:     2 wk    Resident:     Gwendolyn Grant    Comment:     Return in 2 weeks     Flowsheet View for Follow-up Visit    Estimated weeks of       gestation:     27 4/7    Weight:     264    Blood pressure:   117 / 80    Hx headache?     few    Nausea/vomiting?   vom1/d    Edema?     TrLE    Bleeding?     no    Leakage/discharge?   no    Fetal activity:       yes    Labor symptoms?   few ctx    Fundal height:      26    FHR:       132    Taking Vitamins?   Y    Smoking PPD:   n/a    Comment:     Return in 2 weeks    Next visit:     2 wk    Resident:     Iantha Fallen Initial Intake Information    Positive HCG by: Urgent Care    Race: White    Marital status: Single    Occupation: Conservation officer, [employer] for [employer]    Type of work: studying special ed    Education (last grade completed): freshman in  college    Number of children at home: 1    [facility]    Newborn's physician: shaw  FOB Information    Husband/Father of baby: Revonda Humphrey    FOB occupation [employer]    Phone: [phone]    FOB Comments: Very involved.   Menstrual History    LMP (date): 11/02/2009    LMP - Character: light    Menarche: 11 years    Menses interval: 30 days    Menstrual flow 7  days    On BCP's at conception: no

## 2010-06-01 NOTE — Letter (Signed)
Summary: Out of Work  [facility]  [address]   [place], [place] [address]   Phone: [phone]  Fax: [phone]    May 10, 2010   Employee:  KARRIS DEANGELO    To Whom It May Concern:   For Medical reasons, please excuse the above named employee from work for the following dates:  Start:   05/10/10  Bck to work on: 05/12/10  If you need additional information, please feel free to contact our office.         Sincerely,    Bobby Rumpf  MD

## 2010-06-07 ENCOUNTER — Encounter: Payer: Self-pay | Admitting: Family Medicine

## 2010-06-07 DIAGNOSIS — Z3490 Encounter for supervision of normal pregnancy, unspecified, unspecified trimester: Secondary | ICD-10-CM | POA: Insufficient documentation

## 2010-06-08 ENCOUNTER — Encounter: Payer: Self-pay | Admitting: Family Medicine

## 2010-06-13 ENCOUNTER — Ambulatory Visit: Payer: Self-pay | Admitting: Family Medicine

## 2010-06-15 ENCOUNTER — Ambulatory Visit (INDEPENDENT_AMBULATORY_CARE_PROVIDER_SITE_OTHER): Payer: Medicaid Other | Admitting: Family Medicine

## 2010-06-15 ENCOUNTER — Encounter: Payer: Self-pay | Admitting: Family Medicine

## 2010-06-15 VITALS — BP 119/79 | Wt 270.8 lb

## 2010-06-15 DIAGNOSIS — J029 Acute pharyngitis, unspecified: Secondary | ICD-10-CM

## 2010-06-15 DIAGNOSIS — O99820 Streptococcus B carrier state complicating pregnancy: Secondary | ICD-10-CM | POA: Insufficient documentation

## 2010-06-15 DIAGNOSIS — Z3480 Encounter for supervision of other normal pregnancy, unspecified trimester: Secondary | ICD-10-CM

## 2010-06-15 DIAGNOSIS — Z3490 Encounter for supervision of normal pregnancy, unspecified, unspecified trimester: Secondary | ICD-10-CM

## 2010-06-15 DIAGNOSIS — J020 Streptococcal pharyngitis: Secondary | ICD-10-CM

## 2010-06-15 DIAGNOSIS — O09899 Supervision of other high risk pregnancies, unspecified trimester: Secondary | ICD-10-CM

## 2010-06-15 MED ORDER — AMOXICILLIN 500 MG PO CAPS: 500.0000 mg | ORAL_CAPSULE | Freq: Two times a day (BID) | ORAL | Status: AC

## 2010-06-15 NOTE — Assessment & Plan Note (Signed)
Doing well overall.  + strep today.  Treat with Amoxicillin.  Routine follow up.

## 2010-06-15 NOTE — Patient Instructions (Addendum)
It was good to see you today.  Everything is going well. You do have strep throat.  Take the antibiotics twice a day for 10 days.  Please schedule a follow up appointment with Dr. Gwendolyn Grant in 2 weeks.

## 2010-07-12 LAB — GLUCOSE, CAPILLARY: Glucose-Capillary: 94 mg/dL (ref 70–99)

## 2010-07-13 LAB — URINALYSIS, ROUTINE W REFLEX MICROSCOPIC
Glucose, UA: NEGATIVE mg/dL
Hgb urine dipstick: NEGATIVE
Nitrite: NEGATIVE
Specific Gravity, Urine: >1.03 — ABNORMAL HIGH (ref 1.005–1.030)
pH: 6 (ref 5.0–8.0)

## 2010-07-13 LAB — GC/CHLAMYDIA PROBE AMP, GENITAL
Chlamydia, DNA Probe: NEGATIVE
GC Probe Amp, Genital: NEGATIVE

## 2010-07-13 LAB — URINE CULTURE: Culture  Setup Time: 201109220122

## 2010-07-13 LAB — POCT URINALYSIS DIPSTICK
Hgb urine dipstick: NEGATIVE
Ketones, ur: NEGATIVE mg/dL
Protein, ur: NEGATIVE mg/dL
Specific Gravity, Urine: 1.025 (ref 1.005–1.030)
Urobilinogen, UA: 2 mg/dL — ABNORMAL HIGH (ref 0.0–1.0)
pH: 6 (ref 5.0–8.0)

## 2010-07-13 LAB — POCT PREGNANCY, URINE: Preg Test, Ur: POSITIVE

## 2010-07-13 LAB — URINE MICROSCOPIC-ADD ON

## 2010-07-20 ENCOUNTER — Encounter: Payer: Medicaid Other | Admitting: Family Medicine

## 2010-07-24 ENCOUNTER — Ambulatory Visit (INDEPENDENT_AMBULATORY_CARE_PROVIDER_SITE_OTHER): Payer: Medicaid Other | Admitting: Family Medicine

## 2010-07-24 VITALS — BP 120/75 | Wt 288.0 lb

## 2010-07-24 DIAGNOSIS — Z3480 Encounter for supervision of other normal pregnancy, unspecified trimester: Secondary | ICD-10-CM

## 2010-07-24 DIAGNOSIS — Z3490 Encounter for supervision of normal pregnancy, unspecified, unspecified trimester: Secondary | ICD-10-CM

## 2010-07-24 DIAGNOSIS — Z331 Pregnant state, incidental: Secondary | ICD-10-CM

## 2010-07-24 LAB — POCT UA - GLUCOSE/PROTEIN
Glucose, UA: NEGATIVE
Protein, UA: NEGATIVE

## 2010-07-24 NOTE — Assessment & Plan Note (Signed)
Doing well.  Normal BP with manual check.  Irregular contractions.

## 2010-07-24 NOTE — Patient Instructions (Signed)
If you start having more frequent contractions, if she isn't moving as much, or if you have vaginal bleeding or leakage of fluid then go to [facility] right away Please schedule a follow up appointment in 1 week

## 2010-07-24 NOTE — Progress Notes (Signed)
  Subjective:    Kendra [name] is a 20 y.o. female being seen today for her obstetrical visit. She is at [redacted]w[redacted]d gestation. Patient reports no complaints. Fetal movement: normal.  Menstrual History: OB History    Grav Para Term Preterm Abortions TAB SAB Ect Mult Living   3 1 1  0 1 0 1 0 1 1      Menarche age:  Patient's last menstrual period was 11/02/2009.    The following portions of the patient's history were reviewed and updated as appropriate: allergies, current medications, past family history, past medical history, past social history, past surgical history and problem list.  Review of Systems endorses crampy contractions   Objective:    BP 120/75  Wt 288 lb (130.636 kg)  LMP 11/02/2009 FHT: 155 BPM  Uterine Size: 39 cm  Presentations: cephalic  Pelvic Exam:              Dilation: Closed       Effacement: 75% 50%             Station:  -3    Consistency: medium            Position: anterior    Gen: Well appearing Head: NCAT Eyes: normal conjunctiva.  PERRL Abd: Gravid c/w dates, no RUQ tenderness Ext: Trace LE edema Neuro: normal reflexes. Assessment:    Pregnancy 37 and 4/7 weeks   Plan:   Plans for delivery: Vaginal anticipated Beta strep culture: done Counseling:  Fetal testing:  Follow up in 1 Week.

## 2010-07-25 LAB — GC/CHLAMYDIA PROBE AMP, GENITAL: GC Probe Amp, Genital: NEGATIVE

## 2010-07-26 ENCOUNTER — Inpatient Hospital Stay (HOSPITAL_COMMUNITY)
Admission: AD | Admit: 2010-07-26 | Discharge: 2010-07-26 | Disposition: A | Discharge status: Home / Self Care | Payer: Medicaid Other | Source: Ambulatory Visit | Attending: Obstetrics and Gynecology | Admitting: Obstetrics and Gynecology

## 2010-07-26 DIAGNOSIS — O9989 Other specified diseases and conditions complicating pregnancy, childbirth and the puerperium: Secondary | ICD-10-CM

## 2010-07-26 DIAGNOSIS — G43909 Migraine, unspecified, not intractable, without status migrainosus: Secondary | ICD-10-CM | POA: Insufficient documentation

## 2010-07-26 DIAGNOSIS — O99891 Other specified diseases and conditions complicating pregnancy: Secondary | ICD-10-CM | POA: Insufficient documentation

## 2010-07-26 LAB — COMPREHENSIVE METABOLIC PANEL
ALT: 23 U/L (ref 0–35)
AST: 21 U/L (ref 0–37)
Albumin: 2.8 g/dL — ABNORMAL LOW (ref 3.5–5.2)
Calcium: 9 mg/dL (ref 8.4–10.5)
Chloride: 104 mEq/L (ref 96–112)
Creatinine, Ser: 0.47 mg/dL (ref 0.4–1.2)
GFR calc Af Amer: >60 mL/min (ref >60)
Sodium: 134 mEq/L — ABNORMAL LOW (ref 135–145)
Total Bilirubin: 0.5 mg/dL (ref 0.3–1.2)

## 2010-07-26 LAB — CBC
MCH: 30.7 pg (ref 26.0–34.0)
Platelets: 309 10*3/uL (ref 150–400)
RBC: 3.94 MIL/uL (ref 3.87–5.11)

## 2010-07-26 LAB — PROTEIN / CREATININE RATIO, URINE
Protein Creatinine Ratio: 0.09 (ref 0.00–0.15)
Total Protein, Urine: 10 mg/dL

## 2010-07-26 LAB — URINE MICROSCOPIC-ADD ON

## 2010-07-26 LAB — URINALYSIS, ROUTINE W REFLEX MICROSCOPIC
Bilirubin Urine: NEGATIVE
Specific Gravity, Urine: 1.02 (ref 1.005–1.030)
pH: 6 (ref 5.0–8.0)

## 2010-07-30 HISTORY — PX: TUBAL LIGATION: SHX77

## 2010-08-02 LAB — BASIC METABOLIC PANEL
BUN: 8 mg/dL (ref 6–23)
Calcium: 9.4 mg/dL (ref 8.4–10.5)
Creatinine, Ser: 0.8 mg/dL (ref 0.4–1.2)
GFR calc non Af Amer: >60 mL/min (ref >60)

## 2010-08-02 LAB — CBC
Platelets: 288 10*3/uL (ref 150–400)
WBC: 7.4 10*3/uL (ref 4.0–10.5)

## 2010-08-02 LAB — DIFFERENTIAL
Eosinophils Absolute: 0.1 10*3/uL (ref 0.0–0.7)
Lymphocytes Relative: 35 % (ref 12–46)
Lymphs Abs: 2.6 10*3/uL (ref 0.7–4.0)
Neutrophils Relative %: 56 % (ref 43–77)

## 2010-08-03 ENCOUNTER — Ambulatory Visit: Payer: Medicaid Other | Admitting: Family Medicine

## 2010-08-03 VITALS — BP 120/79 | Wt 291.8 lb

## 2010-08-03 DIAGNOSIS — Z3480 Encounter for supervision of other normal pregnancy, unspecified trimester: Secondary | ICD-10-CM

## 2010-08-03 NOTE — Progress Notes (Signed)
  Subjective:    Kendra [name] is a 20 y.o. female being seen today for her obstetrical visit. She is at [redacted]w[redacted]d gestation. Patient reports no complaints. Fetal movement: normal.  Menstrual History: OB History    Grav Para Term Preterm Abortions TAB SAB Ect Mult Living   3 1 1  0 1 0 1 0 1 1      Menarche age:  Patient's last menstrual period was 11/02/2009.    The following portions of the patient's history were reviewed and updated as appropriate: allergies, current medications, past family history, past medical history, past social history, past surgical history and problem list.  Review of Systems Pertinent items are noted in HPI.   Objective:    BP 120/79  Wt 291 lb 12.8 oz (132.36 kg)  LMP 11/02/2009 FHT: 135 BPM  Uterine Size: 41 cm  Presentations:   Pelvic Exam:              Dilation:        Effacement:              Station:      Consistency:             Position:      Assessment:    Pregnancy 39 and 0/7 weeks   Plan:   Plans for delivery: Vaginal anticipated Beta strep culture: done Counseling:  Fetal testing:  Follow up in 1 Week.   Female

## 2010-08-05 LAB — DIFFERENTIAL
Basophils Relative: 1 % (ref 0–1)
Eosinophils Relative: 1 % (ref 0–5)
Lymphocytes Relative: 17 % (ref 12–46)
Lymphs Abs: 1.2 10*3/uL (ref 0.7–4.0)
Monocytes Absolute: 0.6 10*3/uL (ref 0.1–1.0)
Monocytes Relative: 9 % (ref 3–12)
Monocytes Relative: 8 % (ref 3–12)
Neutro Abs: 5.4 10*3/uL (ref 1.7–7.7)
Neutro Abs: 5.2 10*3/uL (ref 1.7–7.7)
Neutrophils Relative %: 73 % (ref 43–77)

## 2010-08-05 LAB — URINALYSIS, ROUTINE W REFLEX MICROSCOPIC
Glucose, UA: NEGATIVE mg/dL
Glucose, UA: NEGATIVE mg/dL
Hgb urine dipstick: NEGATIVE
Protein, ur: NEGATIVE mg/dL
pH: 6.5 (ref 5.0–8.0)

## 2010-08-05 LAB — COMPREHENSIVE METABOLIC PANEL
ALT: 19 U/L (ref 0–35)
AST: 19 U/L (ref 0–37)
Albumin: 3.6 g/dL (ref 3.5–5.2)
Alkaline Phosphatase: 70 U/L (ref 39–117)
BUN: 4 mg/dL — ABNORMAL LOW (ref 6–23)
BUN: 3 mg/dL — ABNORMAL LOW (ref 6–23)
Calcium: 8.8 mg/dL (ref 8.4–10.5)
Chloride: 104 mEq/L (ref 96–112)
Creatinine, Ser: 0.63 mg/dL (ref 0.4–1.2)
GFR calc Af Amer: >60 mL/min (ref >60)
Glucose, Bld: 93 mg/dL (ref 70–99)
Potassium: 3.5 mEq/L (ref 3.5–5.1)
Sodium: 136 mEq/L (ref 135–145)
Sodium: 138 mEq/L (ref 135–145)
Total Protein: 6.8 g/dL (ref 6.0–8.3)
Total Protein: 7 g/dL (ref 6.0–8.3)

## 2010-08-05 LAB — URINE CULTURE

## 2010-08-05 LAB — POCT URINALYSIS DIP (DEVICE)
Hgb urine dipstick: NEGATIVE
Nitrite: NEGATIVE
Specific Gravity, Urine: 1.025 (ref 1.005–1.030)
Urobilinogen, UA: 1 mg/dL (ref 0.0–1.0)
pH: 6 (ref 5.0–8.0)

## 2010-08-05 LAB — POCT PREGNANCY, URINE: Preg Test, Ur: NEGATIVE

## 2010-08-05 LAB — WET PREP, GENITAL
Clue Cells Wet Prep HPF POC: NONE SEEN
Yeast Wet Prep HPF POC: NONE SEEN

## 2010-08-05 LAB — CBC
HCT: 37.5 % (ref 36.0–46.0)
Hemoglobin: 12.7 g/dL (ref 12.0–15.0)
MCHC: 34 g/dL (ref 30.0–36.0)
MCV: 90.2 fL (ref 78.0–100.0)
Platelets: 314 10*3/uL (ref 150–400)
RDW: 12 % (ref 11.5–15.5)
RDW: 12.4 % (ref 11.5–15.5)
WBC: 7.6 10*3/uL (ref 4.0–10.5)

## 2010-08-05 LAB — URINE MICROSCOPIC-ADD ON

## 2010-08-08 ENCOUNTER — Inpatient Hospital Stay (HOSPITAL_COMMUNITY)
Admission: AD | Admit: 2010-08-08 | Discharge: 2010-08-10 | DRG: 767 | Disposition: A | Discharge status: Home / Self Care | Payer: Medicaid Other | Source: Ambulatory Visit | Attending: Family Medicine | Admitting: Family Medicine

## 2010-08-08 DIAGNOSIS — O9989 Other specified diseases and conditions complicating pregnancy, childbirth and the puerperium: Secondary | ICD-10-CM

## 2010-08-08 DIAGNOSIS — Z302 Encounter for sterilization: Secondary | ICD-10-CM

## 2010-08-08 DIAGNOSIS — O0940 Supervision of pregnancy with grand multiparity, unspecified trimester: Secondary | ICD-10-CM

## 2010-08-08 DIAGNOSIS — O99892 Other specified diseases and conditions complicating childbirth: Principal | ICD-10-CM | POA: Diagnosis present

## 2010-08-08 DIAGNOSIS — Z22330 Carrier of Group B streptococcus: Secondary | ICD-10-CM

## 2010-08-08 LAB — CBC
HCT: 36.5 % (ref 36.0–46.0)
Hemoglobin: 11.9 g/dL — ABNORMAL LOW (ref 12.0–15.0)
Hemoglobin: 12.8 g/dL (ref 12.0–15.0)
MCH: 30.1 pg (ref 26.0–34.0)
MCHC: 33.4 g/dL (ref 30.0–36.0)
MCHC: 34.9 g/dL (ref 30.0–36.0)
MCHC: 35.3 g/dL (ref 30.0–36.0)
MCV: 90.1 fL (ref 78.0–100.0)
MCV: 94.4 fL (ref 78.0–100.0)
Platelets: 284 10*3/uL (ref 150–400)
Platelets: 234 10*3/uL (ref 150–400)
Platelets: 248 10*3/uL (ref 150–400)
Platelets: 263 10*3/uL (ref 150–400)
RBC: 3.95 MIL/uL (ref 3.87–5.11)
RDW: 13.4 % (ref 11.5–15.5)
RDW: 13.9 % (ref 11.5–15.5)

## 2010-08-08 LAB — RPR: RPR Ser Ql: NONREACTIVE

## 2010-08-08 LAB — MRSA PCR SCREENING: MRSA by PCR: NEGATIVE

## 2010-08-08 LAB — CCBB MATERNAL DONOR DRAW

## 2010-08-09 ENCOUNTER — Encounter: Payer: Medicaid Other | Admitting: Family Medicine

## 2010-08-09 DIAGNOSIS — Z302 Encounter for sterilization: Secondary | ICD-10-CM

## 2010-08-09 LAB — URINALYSIS, ROUTINE W REFLEX MICROSCOPIC
Bilirubin Urine: NEGATIVE
Nitrite: NEGATIVE
Specific Gravity, Urine: <1.005 — ABNORMAL LOW (ref 1.005–1.030)
Urobilinogen, UA: 0.2 mg/dL (ref 0.0–1.0)

## 2010-08-09 LAB — CBC
Platelets: 243 10*3/uL (ref 150–400)
RDW: 13.8 % (ref 11.5–15.5)
WBC: 12.6 10*3/uL — ABNORMAL HIGH (ref 4.0–10.5)

## 2010-08-10 LAB — URINALYSIS, ROUTINE W REFLEX MICROSCOPIC
Glucose, UA: NEGATIVE mg/dL
Protein, ur: NEGATIVE mg/dL
Urobilinogen, UA: 0.2 mg/dL (ref 0.0–1.0)

## 2010-08-10 LAB — URINE MICROSCOPIC-ADD ON

## 2010-08-10 LAB — CBC
MCHC: 33.8 g/dL (ref 30.0–36.0)
RBC: 3.57 MIL/uL — ABNORMAL LOW (ref 3.87–5.11)
WBC: 10.4 10*3/uL (ref 4.0–10.5)

## 2010-08-11 NOTE — Op Note (Addendum)
Kendra [name], [name]             ACCOUNT NO.:  [id number]  MEDICAL RECORD NO.:  [id number]           PATIENT TYPE:  I  LOCATION:  9104                          FACILITY:  [facility]  PHYSICIAN:  Lesly Dukes, M.D. DATE OF BIRTH:  01-26-1991  DATE OF PROCEDURE:  08/09/2010 DATE OF DISCHARGE:                              OPERATIVE REPORT   PREOPERATIVE DIAGNOSES: 1. Multiparity. 2. Desires permanent sterilization.  POSTOPERATIVE DIAGNOSES: 1. Multiparity. 2. Desires permanent sterilization.  PROCEDURE:  Postpartum bilateral tubal ligation with Filshie clip.  SURGEON:  Lesly Dukes, MD and Dr. Maryelizabeth Kaufmann, MD  ANESTHESIA:  Epidural and local and mild sedation with fentanyl as well.  IV FLUIDS:  500 mL.  URINE OUTPUT:  30 mL of clear urine at the end of procedure.  ESTIMATED BLOOD LOSS:  Minimal.  FINDINGS:  Normal uterus and adnexa bilaterally.  SPECIMENS:  None.  COMPLICATIONS:  None immediate.  INDICATIONS:  This is a 20 year old gravida 3, para 2-0-1-2 status post spontaneous vaginal delivery postpartum day #1 who desires permanent sterilization.  Risks, benefits, and alternatives were discussed with the patient.  After informed consent was obtained including, but not limited to discussing the risk of regret, risk of failure, and risk of possible ectopic pregnancy, and also alternatives that is equally effective with bilateral tubal ligation, the patient decided to proceed with bilateral tubal ligation.  PROCEDURE IN DETAIL:  The patient was taken back to the operative suite where anesthesia was induced.  After anesthesia was found to be adequate, the patient was placed in dorsal supine position.  The patient was prepped and draped in normal sterile fashion.  Again anesthesia was tested and was found to be adequate.  A vertical umbilical incision was then made with a scalpel, carried down through to the underlying fascia until the peritoneum was  identified and into the peritoneum was then noted to have some mild adhesions.  Then the incision was then extended with Metzenbaum scissors.  The patient's right fallopian tube was then identified, brought to the incision, grasped with a Babcock clamp.  The tube was followed out to the fimbriae and the Babcock clamp was then placed in the isthmic region and the Filshie clip was then deployed. The right fallopian tube was then returned to the abdomen and then in similar fashion the left fallopian tube was then identified, brought to the incision, grasped with a Babcock clamp, followed out to the fimbriae, and then the Babcock clamp was placed in the isthmic region and the Filshie clip was then deployed.  Excellent hemostasis was then noted.  Left adnexa was then returned to the abdomen.  The fascia was closed in single layer using a 0-Vicryl. The skin was then closed in a subcuticular fashion using a 4-0 Vicryl. The patient tolerated the procedure well.  Sponge, lap, and needle counts were correct x2.  The patient was taken back to the recovery area in stable condition.  After the subcuticular closure, 2 mL of 0.25% Marcaine was then used for additional local anesthesia, and the patient was taken back to the recovery area in a stable condition.  ______________________________ Maryelizabeth Kaufmann, MD   ______________________________ Lesly Dukes, M.D.    LC/MEDQ  D:  08/09/2010  T:  08/10/2010  Job:  [id number]  Electronically Signed by Elsie Lincoln M.D. on 08/11/2010 12:32:51 PM Electronically Signed by Maryelizabeth Kaufmann MD on 08/23/2010 10:14:25 AM

## 2010-08-15 LAB — COMPREHENSIVE METABOLIC PANEL
ALT: 16 U/L (ref 0–35)
Alkaline Phosphatase: 75 U/L (ref 39–117)
BUN: 4 mg/dL — ABNORMAL LOW (ref 6–23)
Chloride: 108 mEq/L (ref 96–112)
Glucose, Bld: 77 mg/dL (ref 70–99)
Potassium: 3.8 mEq/L (ref 3.5–5.1)
Total Bilirubin: 0.7 mg/dL (ref 0.3–1.2)

## 2010-08-15 LAB — CBC
HCT: 34.8 % — ABNORMAL LOW (ref 36.0–46.0)
Hemoglobin: 11.7 g/dL — ABNORMAL LOW (ref 12.0–15.0)
RBC: 3.69 MIL/uL — ABNORMAL LOW (ref 3.87–5.11)
WBC: 9.7 10*3/uL (ref 4.0–10.5)

## 2010-08-15 LAB — URINALYSIS, ROUTINE W REFLEX MICROSCOPIC
Glucose, UA: NEGATIVE mg/dL
Specific Gravity, Urine: >1.03 — ABNORMAL HIGH (ref 1.005–1.030)
pH: 6 (ref 5.0–8.0)

## 2010-09-12 NOTE — H&P (Signed)
NAMEAMBRIEL, GORELICK             ACCOUNT NO.:  [id number]   MEDICAL RECORD NO.:  [id number]          PATIENT TYPE:  INP   LOCATION:  5127                         FACILITY:  [facility]   PHYSICIAN:  Currie Paris, M.D.DATE OF BIRTH:  1991/01/08   DATE OF ADMISSION:  12/13/2008  DATE OF DISCHARGE:                              HISTORY & PHYSICAL   CHIEF COMPLAINT:  Abdominal pain.   HISTORY OF PRESENT ILLNESS:  Ms. Kendra [name] is pleasant 20 year old African  American female who presents with constant upper and right upper  quadrant abdominal pain.  She states this started a little over a week  ago with intermittent right upper quadrant pain that was postprandial in  nature.  She states that she had already visited the ER here, most  recently this past Friday.  At that time, she was set up for a scheduled  outpatient ultrasound for possible biliary disease, but was unable to  make it to that appointment.  She has had worsening abdominal pain over  the weekend and was brought back to the emergency department this  morning by a family member.  She has had associated nausea and vomiting,  but denies any fevers, chills, weight loss, hematemesis, or  hematochezia.  She states again the pain is now constant in the right  upper quadrant and has subsided some with a little bit of pain medicine  that they have given her in the ED.   PAST MEDICAL HISTORY:  Negative.   PAST SURGICAL HISTORY:  Negative.   FAMILY HISTORY:  Nonpertinent in this case.   SOCIAL HISTORY:  Denies alcohol or tobacco use.   ALLERGIES:  The patient reports over-the-counter ROBITUSSIN or related  products.   MEDICATIONS:  None.   REVIEW OF SYSTEMS:  The patient again denies fevers, chills, weight  loss.  Denies chest pain, shortness of breath.  Denies constipation or  diarrhea.  Denies dysuria, hematuria.  Denies loss of consciousness or  change in mental status.  Denies skin lesions or rashes.   PHYSICAL  EXAMINATION:  VITAL SIGNS:  Temperature is 99.1, pulse is 102,  respiratory rate 20, and blood pressure 146/93.  GENERAL:  A 20 year old obese, African American female in mild  discomfort.  ENT:  Within normal limits.  Otherwise unremarkable.  NECK:  Supple.  No lymphadenopathy.  CHEST:  Clear to auscultation.  No wheezes, rhonchi, or rales.  HEART:  Regular rate and rhythm.  No murmurs, gallops, or rubs.  ABDOMEN:  Soft and nondistended.  She is tender in the right upper  quadrant and epigastric region.  Mild guarding is appreciated.  No mass  effect is noted.  Quiet bowel sounds are found.  PELVIC:  Deferred.  GENITOURINARY:  Deferred.  EXTREMITIES:  Within normal limits with regards to range of motion.  Deep tendon reflexes and peripheral pulses.  No edema is appreciated.  SKIN:  Without lesions or rashes.  It is warm and dry.  NEUROLOGIC:  The patient is alert and oriented x3 is grossly intact with  regards to cranial nerves II through XII.   DIAGNOSTIC STUDIES:  CBC revealed  a normal white blood cell count at 7.4  and hemoglobin normal at 12.7.  Electrolytes within normal limits with  the exception of potassium at 3.3 will be normalized by IV fluids.  Total bilirubin is 1.5.  Alkaline phos is mildly elevated.  Ultrasound  shows numerous tiny gallstones measuring approximately 4-5 mm, no  gallbladder wall thickening or pericholecystic fluid is noted, bile duct  is within normal limits with regards to diameter without inflammatory  changes.  Liver and pancreas otherwise within normal limits on  ultrasound.   ADMISSION DIAGNOSES:  1. Cholelithiasis with biliary colic.  2. Obesity.   ADMISSION PLAN:  We will admit the patient for IV fluid hydration, IV  antibiotics, and surgical intervention for laparoscopic cholecystectomy.   I have discussed the procedure laparoscopic cholecystectomy including  cholangiogram with the patient and her family member at length including  the  risks, complications, postoperative expectations with the patient.  She understands these risk and will consent.  We will begin IV Unasyn  for prophylactic antibiotic dose and proceed to the operating room.  Case was discussed at length with Dr. Cyndia Bent who is in  agreement, we will proceed this.      Brayton El, PA-C      Currie Paris, M.D.  Electronically Signed    KB/MEDQ  D:  12/13/2008  T:  12/14/2008  Job:  [id number]

## 2010-09-12 NOTE — Op Note (Signed)
Kendra [name], Kendra [name]             ACCOUNT NO.:  [id number]   MEDICAL RECORD NO.:  [id number]          PATIENT TYPE:  INP   LOCATION:  5127                         FACILITY:  [facility]   PHYSICIAN:  Currie Paris, M.D.DATE OF BIRTH:  [birth year]   DATE OF PROCEDURE:  12/13/2008  DATE OF DISCHARGE:                               OPERATIVE REPORT   PREOPERATIVE DIAGNOSIS:  Acute cholecystitis.   POSTOPERATIVE DIAGNOSIS:  Acute cholecystitis.   OPERATION:  Laparoscopic cholecystectomy with operative cholangiogram.   SURGEON:  Currie Paris, MD   ASSISTANT:  Letha Cape, PA   ANESTHESIA:  General endotracheal.   CLINICAL HISTORY:  This is a 20 year old young lady who is about 3  months' postpartum with signs and symptoms of acute cholecystitis.  She  is noted to have gallstones on ultrasound today.  She was scheduled for  urgent cholecystectomy.   DESCRIPTION OF PROCEDURE:  The patient was seen in the holding area.  I  saw her with her grandmother.  I reviewed the plans for surgery risks  and complications.  All questions were answered.   The patient was taken to the operating room and after satisfactory  general endotracheal anesthesia had been obtained, the abdomen was  prepped and draped.  The time-out was done.   I used 0.25% Marcaine with epi for each incision.  The umbilical area  was anesthetized with the local.  Skin incision was made.  The fascia  was identified and opened, and the peritoneal cavity entered under  direct vision.  Hasson was introduced and held in place with a  pursestring.   The patient was put in reverse Trendelenburg and tilted left.  A 10/11  trocar was placed in the epigastrium under direct vision and two 5-mm  were placed similarly in the right midabdomen.   The gallbladder was tensely distended and could not be grasped, so I  decompressed it.  It had white bile.   With the gallbladder was retracted, I was able to dissect out the  cystic  duct and opened a nice big window and see the cystic artery posterior to  it and had that dissected out.  I put 1 clip on the artery and 1 on the  duct.   A Cook catheter was introduced percutaneously and the cystic duct was  opened and the catheter was placed and held with a clip.  Operative  angiography showed what looked like a normal common duct, normal hepatic  radicals, and good filling of the duodenum with no filling defects.   The cystic duct catheter was removed and 3 clips were placed on the stay  side and the duct was divided.  Two additional clips were placed on the  artery and it was divided.  The gallbladder was removed from below to  above.  It was fairly edematous and it was somewhat intrahepatic.   Once it was disconnected, I put it in a bag.  I irrigated to try to make  sure everything was dry and there was 1 little area that oozed along the  right edge of the gallbladder.  I put some Surgicel there and then  brought the gallbladder out the umbilical port.  I placed that port back  and re-insufflated and all of it appeared that the area that I put the  Surgicel just had been a little ooze.  I went ahead and put some FloSeal  in to be sure we did have any postoperative bleeding.   The lateral ports were then removed under direct vision and there was no  bleeding.  The umbilical site was closed with a pursestring.  The skin  was closed with 4-0 Monocryl subcuticular and Dermabond.   The patient tolerated the procedure well and there were no  complications.  All counts were correct.      Currie Paris, M.D.  Electronically Signed     CJS/MEDQ  D:  12/13/2008  T:  12/14/2008  Job:  [id number]

## 2010-09-19 ENCOUNTER — Ambulatory Visit: Payer: Medicaid Other | Admitting: Family Medicine

## 2010-10-02 ENCOUNTER — Ambulatory Visit (INDEPENDENT_AMBULATORY_CARE_PROVIDER_SITE_OTHER): Payer: Medicaid Other | Admitting: Family Medicine

## 2010-10-02 ENCOUNTER — Encounter: Payer: Self-pay | Admitting: Family Medicine

## 2010-10-02 NOTE — Progress Notes (Signed)
  Subjective:     Kendra [name] is a 20 y.o. female who presents for a postpartum visit. She is 7 weeks postpartum following a spontaneous vaginal delivery. I have fully reviewed the prenatal and intrapartum course. The delivery was at 40 gestational weeks. Outcome: spontaneous vaginal delivery. Anesthesia: epidural. Postpartum course has been uncomplicated. Baby's course has been uncomplicated. Baby is feeding by bottle - Enfamil AR. Bleeding no bleeding. Bowel function is normal. Bladder function is normal. Patient is not sexually active. Contraception method is tubal ligation. Postpartum depression screening: negative.  The following portions of the patient's history were reviewed and updated as appropriate: allergies, current medications, past family history, past medical history, past social history, past surgical history and problem list.  Review of Systems Pertinent items are noted in HPI.   Objective:    BP 121/78  Pulse 86  Temp(Src) 98.4 F (36.9 C) (Oral)  Ht 5' 7.5" (1.715 m)  Wt 290 lb (131.543 kg)  BMI 44.75 kg/m2  LMP 11/02/2009  General:  alert and moderately obese   Breasts:  Not examined  Lungs: clear to auscultation bilaterally  Heart:  regular rate and rhythm, S1, S2 normal, no murmur, click, rub or gallop  Abdomen: soft, non-tender; bowel sounds normal; no masses,  no organomegaly   Vulva:  normal  Vagina: normal vagina  Cervix:  normal  Corpus: not examined  Adnexa:  normal adnexa  Rectal Exam: Not performed.        Assessment:    Routine postpartum exam. Pap smear not done at today's visit.   Plan:    1. Contraception: tubal ligation 2. No signs of postpartum depression 3. Follow up in: 3 months or as needed.

## 2011-02-02 LAB — URINALYSIS, ROUTINE W REFLEX MICROSCOPIC
Hgb urine dipstick: NEGATIVE
Specific Gravity, Urine: >1.03 — ABNORMAL HIGH (ref 1.005–1.030)
Urobilinogen, UA: 0.2 mg/dL (ref 0.0–1.0)

## 2011-02-13 ENCOUNTER — Encounter (HOSPITAL_COMMUNITY): Payer: Self-pay | Admitting: *Deleted

## 2011-07-18 ENCOUNTER — Ambulatory Visit (INDEPENDENT_AMBULATORY_CARE_PROVIDER_SITE_OTHER): Payer: Medicaid Other | Admitting: *Deleted

## 2011-07-18 DIAGNOSIS — Z111 Encounter for screening for respiratory tuberculosis: Secondary | ICD-10-CM

## 2011-07-20 ENCOUNTER — Ambulatory Visit (INDEPENDENT_AMBULATORY_CARE_PROVIDER_SITE_OTHER): Payer: Medicaid Other | Admitting: *Deleted

## 2011-07-20 DIAGNOSIS — Z111 Encounter for screening for respiratory tuberculosis: Secondary | ICD-10-CM

## 2011-07-20 LAB — TB SKIN TEST
Induration: 0
TB Skin Test: NEGATIVE mm

## 2011-08-22 ENCOUNTER — Emergency Department (INDEPENDENT_AMBULATORY_CARE_PROVIDER_SITE_OTHER)
Admission: EM | Admit: 2011-08-22 | Discharge: 2011-08-22 | Disposition: A | Discharge status: Home / Self Care | Payer: Self-pay | Source: Home / Self Care | Attending: Family Medicine | Admitting: Family Medicine

## 2011-08-22 ENCOUNTER — Encounter (HOSPITAL_COMMUNITY): Payer: Self-pay | Admitting: *Deleted

## 2011-08-22 DIAGNOSIS — A09 Infectious gastroenteritis and colitis, unspecified: Secondary | ICD-10-CM

## 2011-08-22 DIAGNOSIS — A084 Viral intestinal infection, unspecified: Secondary | ICD-10-CM

## 2011-08-22 HISTORY — DX: Essential (primary) hypertension: I10

## 2011-08-22 MED ORDER — ONDANSETRON 4 MG PO TBDP
ORAL_TABLET | ORAL | Status: AC
  Filled 2011-08-22: qty 2

## 2011-08-22 MED ORDER — ONDANSETRON 4 MG PO TBDP
8.0000 mg | ORAL_TABLET | Freq: Once | ORAL | Status: AC
  Administered 2011-08-22: 8 mg via ORAL

## 2011-08-22 MED ORDER — ONDANSETRON HCL 4 MG PO TABS: 4.0000 mg | ORAL_TABLET | Freq: Four times a day (QID) | ORAL | Status: DC

## 2011-08-22 MED ORDER — LOPERAMIDE HCL 2 MG PO CAPS: 2.0000 mg | ORAL_CAPSULE | Freq: Four times a day (QID) | ORAL | Status: DC | PRN

## 2011-08-22 NOTE — ED Notes (Signed)
Pt with c/o n/v/d onset last night last vomited approx one hour prior to arrival - today episodes x 5

## 2011-08-22 NOTE — Discharge Instructions (Signed)
Clear liquid , bland diet tonight as tolerated, advance on thurs as improved, use medicine as needed for vomiting and diarrhea, return or see your doctor if any problems.

## 2011-08-22 NOTE — ED Provider Notes (Signed)
History     CSN: [id number]  Arrival date & time 08/22/11  5284   First MD Initiated Contact with Patient 08/22/11 1955      Chief Complaint  Patient presents with  . Nausea  . Emesis  . Diarrhea    (Consider location/radiation/quality/duration/timing/severity/associated sxs/prior treatment) Patient is a 21 y.o. female presenting with vomiting. The history is provided by the patient.  Emesis  This is a new problem. The current episode started 12 to 24 hours ago. The problem occurs 5 to 10 times per day. The problem has not changed since onset.The emesis has an appearance of stomach contents. There has been no fever. Associated symptoms include diarrhea. Risk factors include ill contacts (daughter with similar last week.).    Past Medical History  Diagnosis Date  . Hypertension     Past Surgical History  Procedure Date  . Cholecystectomy     History reviewed. No pertinent family history.  History  Substance Use Topics  . Smoking status: Never Smoker   . Smokeless tobacco: Not on file  . Alcohol Use: No    OB History    Grav Para Term Preterm Abortions TAB SAB Ect Mult Living   3 2 2  0 1 0 1 0 1 1      Review of Systems  Constitutional: Positive for activity change and appetite change.  Gastrointestinal: Positive for nausea, vomiting and diarrhea.    Allergies  Review of patient's allergies indicates no known allergies.  Home Medications   Current Outpatient Rx  Name Route Sig Dispense Refill  . FLINTSTONES COMPLETE 60 MG PO CHEW       . LOPERAMIDE HCL 2 MG PO TABS Oral Take 2 mg by mouth 3 (three) times daily as needed.      Marland Kitchen LOPERAMIDE HCL 2 MG PO CAPS Oral Take 1 capsule (2 mg total) by mouth 4 (four) times daily as needed for diarrhea or loose stools. 12 capsule 0  . ONDANSETRON HCL 4 MG PO TABS Oral Take 4 mg by mouth every 6 (six) hours as needed.     Marland Kitchen ONDANSETRON HCL 4 MG PO TABS Oral Take 1 tablet (4 mg total) by mouth every 6 (six) hours. 10  tablet 0  . RANITIDINE HCL 150 MG PO CAPS Oral Take 150 mg by mouth 2 (two) times daily.        BP 137/85  Pulse 73  Temp(Src) 98.4 F (36.9 C) (Oral)  Resp 16  SpO2 100%  LMP 07/19/2011  Breastfeeding? No  Physical Exam  Nursing note and vitals reviewed. Constitutional: She appears well-developed and well-nourished.  HENT:  Head: Normocephalic.  Right Ear: External ear normal.  Left Ear: External ear normal.  Mouth/Throat: Oropharynx is clear and moist.  Neck: Normal range of motion. Neck supple.  Cardiovascular: Normal rate, regular rhythm, normal heart sounds and intact distal pulses.   Pulmonary/Chest: Effort normal and breath sounds normal.  Abdominal: Soft. Normal appearance. Bowel sounds are decreased. There is no hepatosplenomegaly. There is generalized tenderness. There is no rigidity, no rebound, no guarding, no CVA tenderness, no tenderness at McBurney's point and negative Murphy's sign.  Lymphadenopathy:    She has no cervical adenopathy.    ED Course  Procedures (including critical care time)  Labs Reviewed - No data to display No results found.   1. Gastroenteritis and colitis, viral       MDM          Linna Hoff, MD 08/25/11  0908 

## 2011-09-25 ENCOUNTER — Inpatient Hospital Stay (HOSPITAL_COMMUNITY): Payer: Self-pay

## 2011-09-25 ENCOUNTER — Ambulatory Visit (INDEPENDENT_AMBULATORY_CARE_PROVIDER_SITE_OTHER): Payer: Self-pay | Admitting: Family Medicine

## 2011-09-25 ENCOUNTER — Encounter: Payer: Self-pay | Admitting: Family Medicine

## 2011-09-25 ENCOUNTER — Inpatient Hospital Stay (HOSPITAL_COMMUNITY)
Admission: AD | Admit: 2011-09-25 | Discharge: 2011-09-25 | Disposition: A | Discharge status: Home / Self Care | Payer: Self-pay | Source: Ambulatory Visit | Attending: Obstetrics and Gynecology | Admitting: Obstetrics and Gynecology

## 2011-09-25 ENCOUNTER — Encounter (HOSPITAL_COMMUNITY): Payer: Self-pay | Admitting: *Deleted

## 2011-09-25 VITALS — BP 141/86 | HR 76 | Temp 97.9°F | Ht 67.5 in | Wt 283.0 lb

## 2011-09-25 DIAGNOSIS — O210 Mild hyperemesis gravidarum: Secondary | ICD-10-CM | POA: Insufficient documentation

## 2011-09-25 DIAGNOSIS — Z3200 Encounter for pregnancy test, result unknown: Secondary | ICD-10-CM

## 2011-09-25 DIAGNOSIS — N912 Amenorrhea, unspecified: Secondary | ICD-10-CM

## 2011-09-25 DIAGNOSIS — R109 Unspecified abdominal pain: Secondary | ICD-10-CM | POA: Insufficient documentation

## 2011-09-25 DIAGNOSIS — Z9851 Tubal ligation status: Secondary | ICD-10-CM

## 2011-09-25 DIAGNOSIS — Z331 Pregnant state, incidental: Secondary | ICD-10-CM

## 2011-09-25 LAB — CBC
HCT: 36.8 % (ref 36.0–46.0)
Hemoglobin: 12.4 g/dL (ref 12.0–15.0)
MCH: 29.2 pg (ref 26.0–34.0)
MCHC: 33.7 g/dL (ref 30.0–36.0)
RDW: 13 % (ref 11.5–15.5)

## 2011-09-25 LAB — WET PREP, GENITAL: Yeast Wet Prep HPF POC: NONE SEEN

## 2011-09-25 LAB — DIFFERENTIAL
Basophils Absolute: 0 10*3/uL (ref 0.0–0.1)
Basophils Relative: 0 % (ref 0–1)
Monocytes Absolute: 0.5 10*3/uL (ref 0.1–1.0)
Neutro Abs: 5.5 10*3/uL (ref 1.7–7.7)
Neutrophils Relative %: 57 % (ref 43–77)

## 2011-09-25 LAB — HCG, QUANTITATIVE, PREGNANCY: hCG, Beta Chain, Quant, S: 27442 m[IU]/mL — ABNORMAL HIGH (ref <5)

## 2011-09-25 NOTE — MAU Provider Note (Signed)
History     CSN: [id number]  Arrival date & time 09/25/11  1747   None     Chief Complaint  Patient presents with  . Abdominal Cramping    HPI Kendra [name] is a 21 y.o. female @ [redacted]w[redacted]d gestation who presents to MAU for abdominal pain, nausea and vomiting that started a few weeks ago. The pain has been off and on the nausea and vomiting started yesterday. Went to [facility] today and told she had a positive pregnancy test and sent to MAU for evaluation to make sure it was not an ectopic pregnancy. Patient has had BTL for birth control. The history was provided by the patient and her medical record.  Past Medical History  Diagnosis Date  . Hypertension     Past Surgical History  Procedure Date  . Cholecystectomy   . Tubal ligation     History reviewed. No pertinent family history.  History  Substance Use Topics  . Smoking status: Never Smoker   . Smokeless tobacco: Not on file  . Alcohol Use: No    OB History    Grav Para Term Preterm Abortions TAB SAB Ect Mult Living   4 2 2  0 1 0 1 0 0 2      Review of Systems  Constitutional: Positive for fatigue. Negative for fever, chills and diaphoresis.  HENT: Positive for congestion. Negative for ear pain, sore throat, facial swelling, neck pain, neck stiffness, dental problem and sinus pressure.   Eyes: Positive for visual disturbance (blurry sometimes). Negative for photophobia, pain and discharge.  Respiratory: Negative for cough, chest tightness and wheezing.   Cardiovascular: Negative for chest pain and palpitations.  Gastrointestinal: Positive for nausea, vomiting, abdominal pain and constipation. Negative for diarrhea and abdominal distention.  Genitourinary: Positive for frequency, vaginal discharge and pelvic pain. Negative for dysuria, flank pain, vaginal bleeding and difficulty urinating.  Musculoskeletal: Positive for back pain. Negative for myalgias and gait problem.  Skin: Negative for color  change and rash.  Neurological: Positive for dizziness and headaches. Negative for speech difficulty, weakness, light-headedness and numbness.  Psychiatric/Behavioral: Negative for confusion and agitation. The patient is not nervous/anxious.     Allergies  Review of patient's allergies indicates no known allergies.  Home Medications  No current outpatient prescriptions on file.  BP 139/78  Pulse 70  Temp(Src) 98 F (36.7 C) (Oral)  Resp 16  LMP 07/26/2011  Physical Exam  Nursing note and vitals reviewed. Constitutional: She is oriented to person, place, and time. She appears well-developed and well-nourished. No distress.  HENT:  Head: Normocephalic.  Eyes: EOM are normal.  Neck: Neck supple.  Cardiovascular: Normal rate.   Pulmonary/Chest: Effort normal.  Abdominal: Soft. There is no tenderness.  Genitourinary:       Speculum exam: Vulva- negative Vagina - Small amount of frothy discharge, no odor Cervix - No contact bleeding Bimanual exam: Cervix closed Uterus non tender Adnexa non tender, no masses bilaterally GC/Chlam, wet prep done Chaperone present for exam.  Musculoskeletal: Normal range of motion.  Neurological: She is alert and oriented to person, place, and time. No cranial nerve deficit.  Skin: Skin is warm and dry.  Psychiatric: She has a normal mood and affect. Her behavior is normal. Judgment and thought content normal.    Results for orders placed in visit on 09/25/11 (from the past 24 hour(s))  POCT URINE PREGNANCY     Status: Normal   Collection Time   09/25/11  4:38 PM      Component Value Range   Preg Test, Ur Positive     Results for orders placed during the hospital encounter of 09/25/11 (from the past 24 hour(s))  CBC     Status: Normal   Collection Time   09/25/11  6:51 PM      Component Value Range   WBC 9.5  4.0 - 10.5 (K/uL)   RBC 4.24  3.87 - 5.11 (MIL/uL)   Hemoglobin 12.4  12.0 - 15.0 (g/dL)   HCT 16.1  09.6 - 04.5 (%)   MCV 86.8   78.0 - 100.0 (fL)   MCH 29.2  26.0 - 34.0 (pg)   MCHC 33.7  30.0 - 36.0 (g/dL)   RDW 40.9  81.1 - 91.4 (%)   Platelets 277  150 - 400 (K/uL)  DIFFERENTIAL     Status: Normal   Collection Time   09/25/11  6:51 PM      Component Value Range   Neutrophils Relative 57  43 - 77 (%)   Neutro Abs 5.5  1.7 - 7.7 (K/uL)   Lymphocytes Relative 36  12 - 46 (%)   Lymphs Abs 3.5  0.7 - 4.0 (K/uL)   Monocytes Relative 5  3 - 12 (%)   Monocytes Absolute 0.5  0.1 - 1.0 (K/uL)   Eosinophils Relative 1  0 - 5 (%)   Eosinophils Absolute 0.1  0.0 - 0.7 (K/uL)   Basophils Relative 0  0 - 1 (%)   Basophils Absolute 0.0  0.0 - 0.1 (K/uL)  HCG, QUANTITATIVE, PREGNANCY     Status: Abnormal   Collection Time   09/25/11  7:40 PM      Component Value Range   hCG, Beta Chain, Quant, S 78295 (*) <5 (mIU/mL)  ABO/RH     Status: Normal   Collection Time   09/25/11  7:40 PM      Component Value Range   ABO/RH(D) O POS    WET PREP, GENITAL     Status: Abnormal   Collection Time   09/25/11  8:50 PM      Component Value Range   Yeast Wet Prep HPF POC NONE SEEN  NONE SEEN    Trich, Wet Prep NONE SEEN  NONE SEEN    Clue Cells Wet Prep HPF POC FEW (*) NONE SEEN    WBC, Wet Prep HPF POC FEW (*) NONE SEEN    ED Course  Procedures  Patient care turned over to Nolene Bernheim, FNP, labs and ultrasound pending. [place] reviewed - IUP at 7w 5d with FHT  Assessment 7w 5d IUP Morning sickness  Plan Begin prenatal care Continue prenatal vitamins Client declines meds for nausea

## 2011-09-25 NOTE — MAU Note (Signed)
Pt states she had her tubes tied April 10th  2012 and missed two periods, went to see physician and pregnancy test was positive today. States some cramping in there lower part of her abdomen and it is constant

## 2011-09-25 NOTE — Patient Instructions (Signed)
Myca,   I am so sorry that your urine pregnancy test was positive. I know this was not your hope as you have had your tubes tied. [facility] I am going to call to let them know you are coming. They will do an ultrasound to see where the pregnancy is and determine what your options are.   Once again I am sorry for this happening, Dr. Durene Cal  Let us know if there is anything we can do for you.

## 2011-09-25 NOTE — Assessment & Plan Note (Signed)
History of tubal ligation and positive pregnancy. High concern for ectopic. Planned to get quantitative HCG and ultrasound. Discussed with preceptor, Dr. Jennette Kettle, who thought it would be best if patient evaluated and treated in MAU. Would be difficult to arrange u/s after 5 and would take longer to get quant from here than at MAU. Will send to MAU to rule out ectopic. Suspect ectopic given BTL.

## 2011-09-25 NOTE — Progress Notes (Signed)
  Subjective:    Patient ID: Kendra [name], female    DOB: 02-04-1991, 21 y.o.   MRN: [id number]  HPI  1. Abdominal Pain/amenorrhea-history of tubal ligation. Patient without period in 2 months. She has also had nausea and abdominal apin during this time. She says this feels like when she has been pregnant previously. Upreg + in lab. Also complains of vaginal discharge. Informed patient of upreg and she is very tearful and wants to know next steps.   Review of Systems -See HPI  Past Medical History-obesity.  Reviewed and updated problem list.  Medications- reviewed and updated Chief complaint-noted    Objective:   Physical Exam  Constitutional: She is oriented to person, place, and time. She appears well-developed and well-nourished.       Tearful during exam  HENT:  Head: Normocephalic and atraumatic.  Neck: Normal range of motion. Neck supple.  Cardiovascular: Normal rate and regular rhythm.   Pulmonary/Chest: Effort normal and breath sounds normal.  Abdominal: Soft. Bowel sounds are normal. She exhibits no distension.  Musculoskeletal: Normal range of motion. She exhibits no edema.  Neurological: She is alert and oriented to person, place, and time.  Skin: Skin is warm and dry.      Assessment & Plan:

## 2011-09-25 NOTE — Discharge Instructions (Signed)
No smoking, no drugs, no alcohol.  Take a prenatal vitamin one by mouth every day.  Eat small frequent snacks to avoid nausea.  Begin prenatal care as soon as possible. ° °

## 2011-09-26 LAB — GC/CHLAMYDIA PROBE AMP, GENITAL
Chlamydia, DNA Probe: NEGATIVE
GC Probe Amp, Genital: NEGATIVE

## 2011-09-26 IMAGING — CR DG CHEST 2V
2 series · 2 of 2 positions shown · non-contrast
Comparison: None.

CLINICAL DATA: Cough; 25 weeks pregnant.

CHEST - 2 VIEW

[view not recorded (1 of 2)]
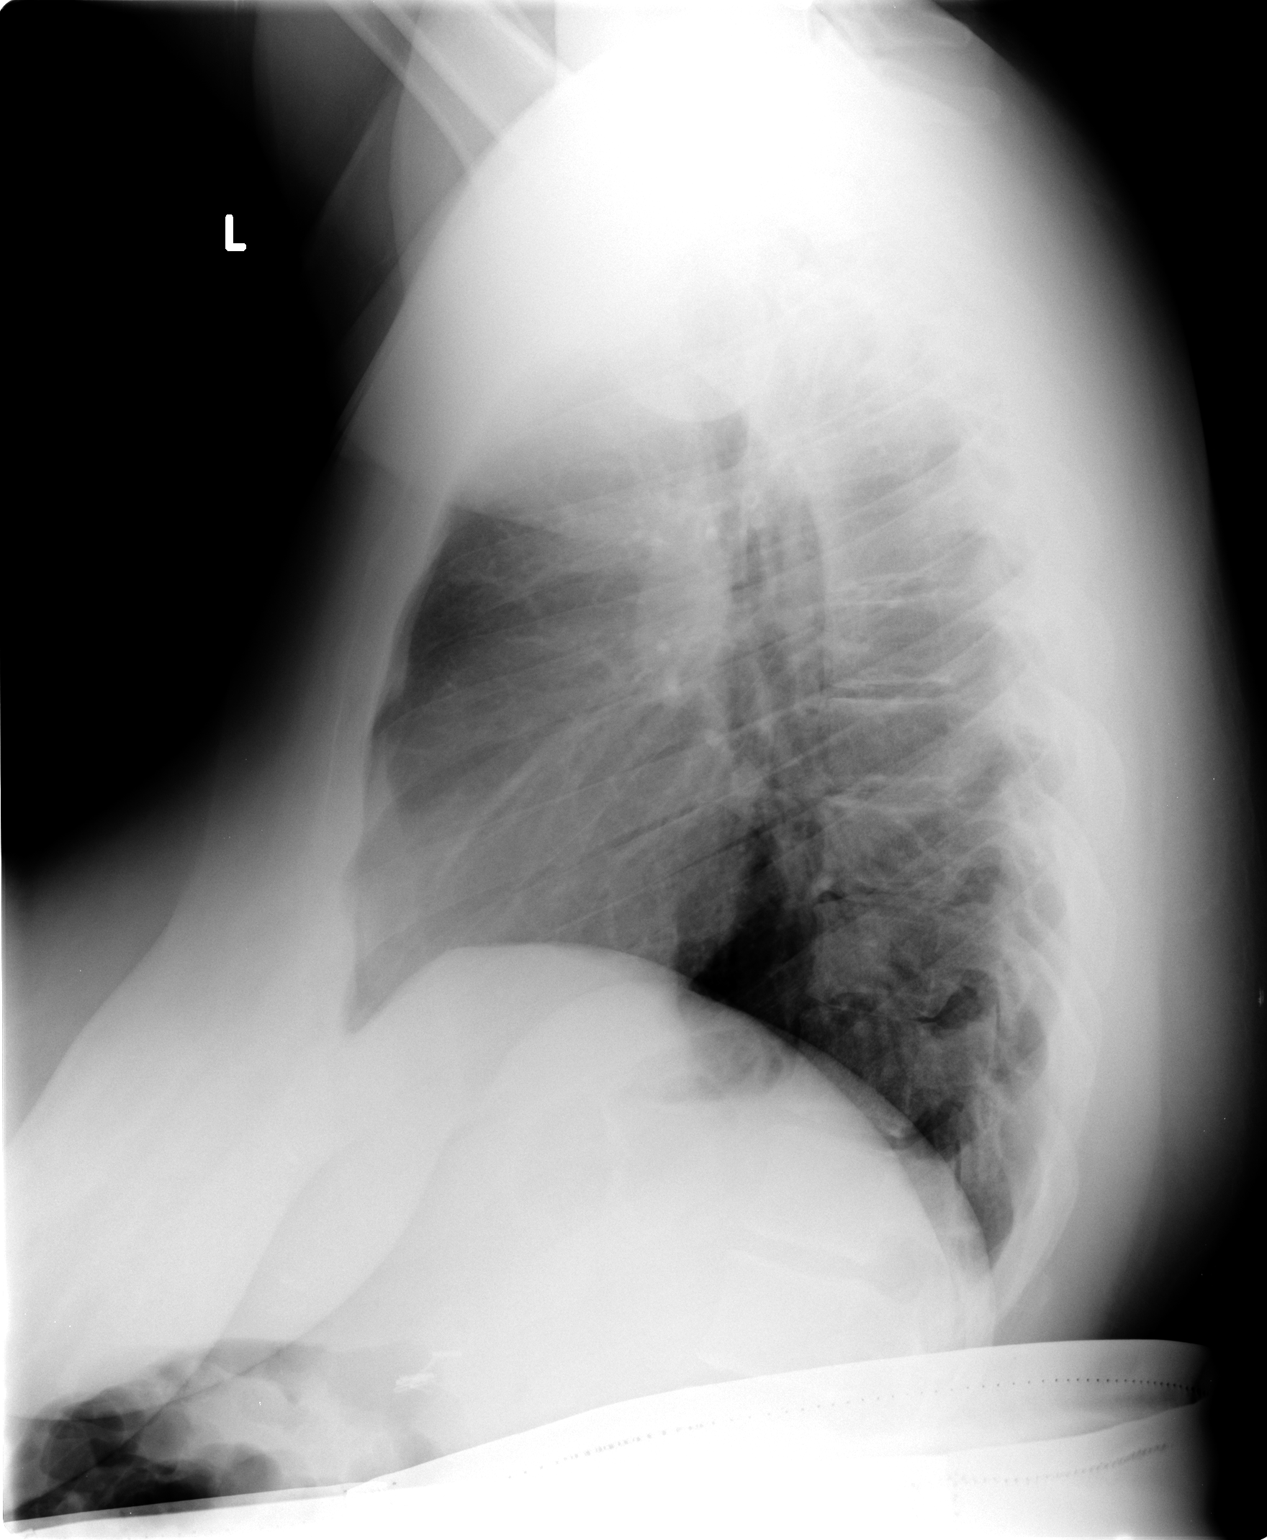

[view not recorded (2 of 2)]
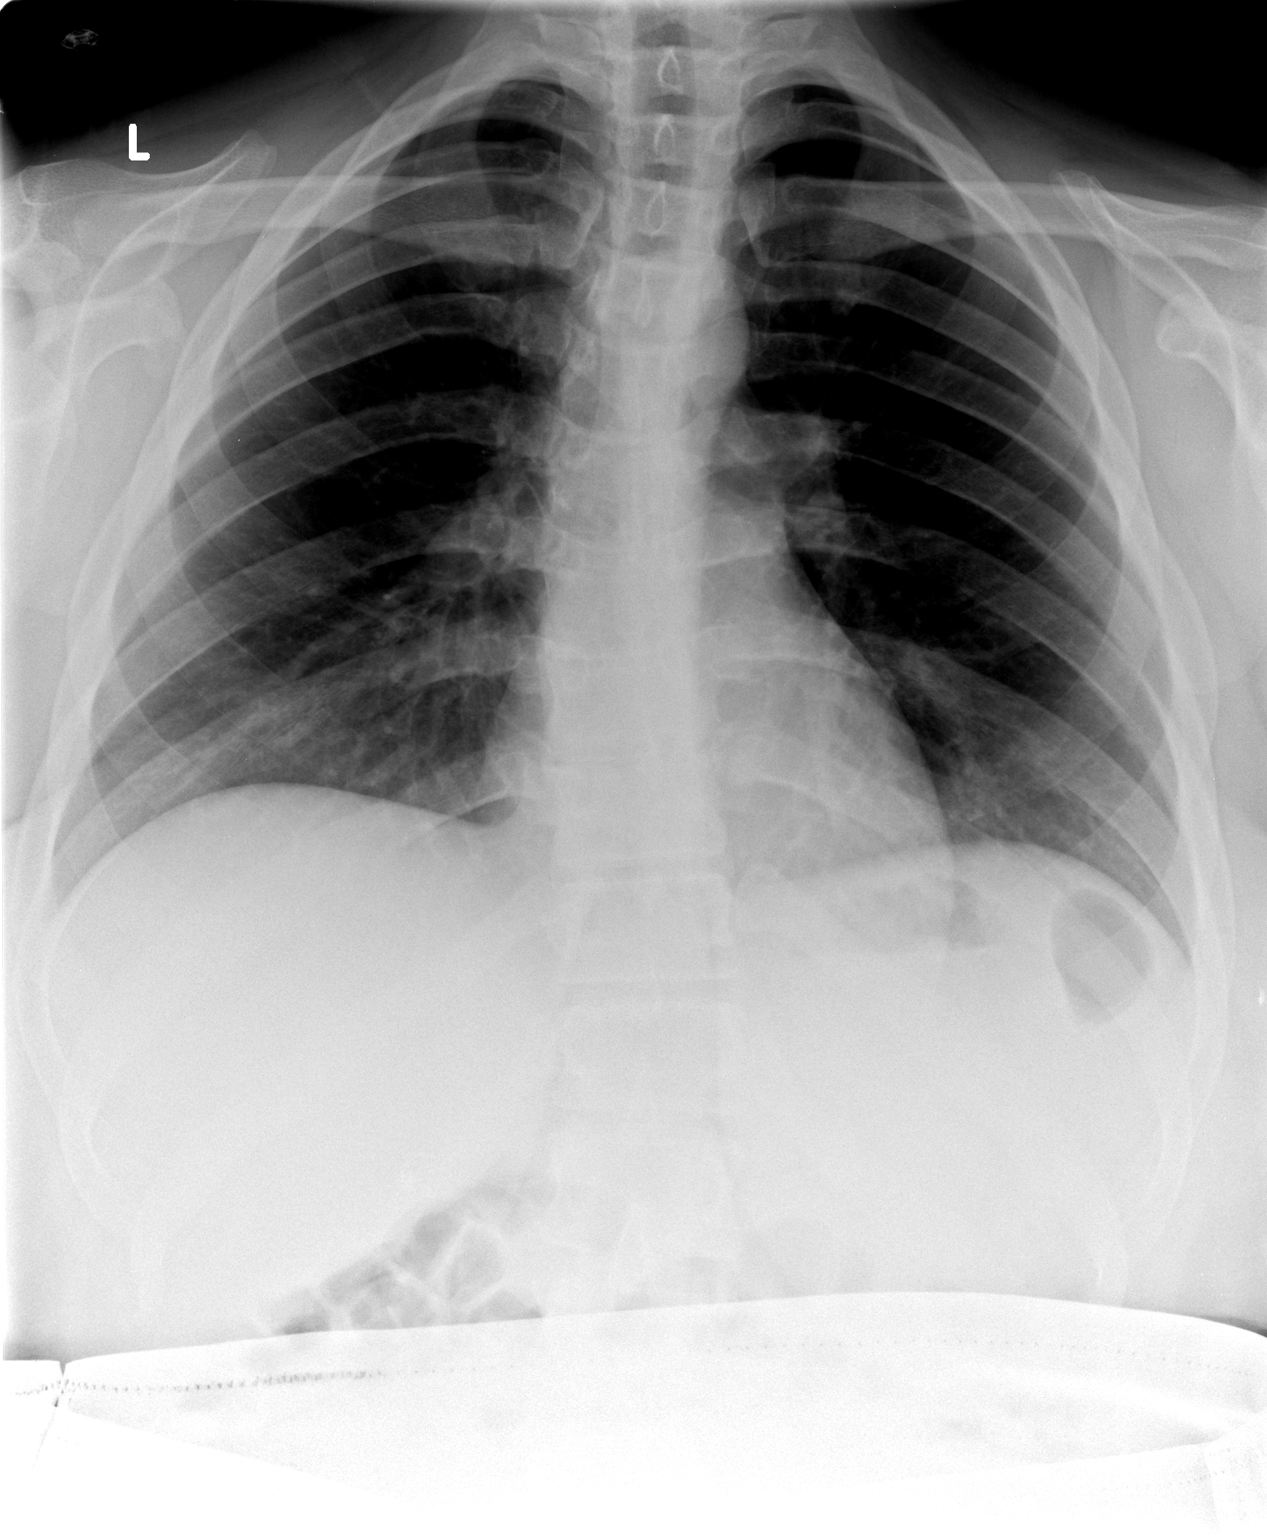

[2 of 2 positions shown; findings below may reference images not displayed]

FINDINGS: The lungs are well-aerated and clear.  There is no
evidence of focal opacification, pleural effusion or pneumothorax.

The heart is normal in size; the mediastinal contour is within
normal limits.  No acute osseous abnormalities are seen.   Clips
are noted within the right upper quadrant, reflecting prior
cholecystectomy.  Abdominal shielding is noted.
IMPRESSION: No acute cardiopulmonary process seen.

## 2011-09-26 NOTE — MAU Provider Note (Signed)
Agree with above note.  Chiann Goffredo 09/26/2011 7:34 AM

## 2011-09-27 ENCOUNTER — Emergency Department (HOSPITAL_COMMUNITY)
Admission: EM | Admit: 2011-09-27 | Discharge: 2011-09-27 | Disposition: A | Discharge status: Home / Self Care | Payer: Self-pay | Attending: Emergency Medicine | Admitting: Emergency Medicine

## 2011-09-27 ENCOUNTER — Other Ambulatory Visit: Payer: Self-pay

## 2011-09-27 ENCOUNTER — Encounter (HOSPITAL_COMMUNITY): Payer: Self-pay | Admitting: *Deleted

## 2011-09-27 DIAGNOSIS — O2690 Pregnancy related conditions, unspecified, unspecified trimester: Secondary | ICD-10-CM | POA: Insufficient documentation

## 2011-09-27 DIAGNOSIS — Z3490 Encounter for supervision of normal pregnancy, unspecified, unspecified trimester: Secondary | ICD-10-CM

## 2011-09-27 DIAGNOSIS — F410 Panic disorder [episodic paroxysmal anxiety] without agoraphobia: Secondary | ICD-10-CM | POA: Insufficient documentation

## 2011-09-27 DIAGNOSIS — I10 Essential (primary) hypertension: Secondary | ICD-10-CM | POA: Insufficient documentation

## 2011-09-27 MED ORDER — DIPHENHYDRAMINE HCL 25 MG PO CAPS
25.0000 mg | ORAL_CAPSULE | Freq: Once | ORAL | Status: AC
Start: 2011-09-27 — End: 2011-09-27
  Administered 2011-09-27: 25 mg via ORAL
  Filled 2011-09-27: qty 1

## 2011-09-27 MED ORDER — PRENATAL COMPLETE 14-0.4 MG PO TABS: 1.0000 | ORAL_TABLET | Freq: Every day | ORAL | Status: DC

## 2011-09-27 NOTE — ED Notes (Signed)
Waiting on act team to come speak with pt

## 2011-09-27 NOTE — ED Provider Notes (Signed)
Medical screening examination/treatment/procedure(s) were performed by non-physician practitioner and as supervising physician I was immediately available for consultation/collaboration.   Airyanna Dipalma, MD 09/27/11 1109 

## 2011-09-27 NOTE — ED Notes (Signed)
Kendra Aloe, rn reported that pt wanted to leave, henderson discussed with pt staying and receiving treatment. Medication being administered.

## 2011-09-27 NOTE — Discharge Instructions (Signed)
Anxiety and Panic Attacks Your caregiver has informed you that you are having an anxiety or panic attack. There may be many forms of this. Most of the time these attacks come suddenly and without warning. They come at any time of day, including periods of sleep, and at any time of life. They may be strong and unexplained. Although panic attacks are very scary, they are physically harmless. Sometimes the cause of your anxiety is not known. Anxiety is a protective mechanism of the body in its fight or flight mechanism. Most of these perceived danger situations are actually nonphysical situations (such as anxiety over losing a job). CAUSES  The causes of an anxiety or panic attack are many. Panic attacks may occur in otherwise healthy people given a certain set of circumstances. There may be a genetic cause for panic attacks. Some medications may also have anxiety as a side effect. SYMPTOMS  Some of the most common feelings are:  Intense terror.   Dizziness, feeling faint.   Hot and cold flashes.   Fear of going crazy.   Feelings that nothing is real.   Sweating.   Shaking.   Chest pain or a fast heartbeat (palpitations).   Smothering, choking sensations.   Feelings of impending doom and that death is near.   Tingling of extremities, this may be from over-breathing.   Altered reality (derealization).   Being detached from yourself (depersonalization).  Several symptoms can be present to make up anxiety or panic attacks. DIAGNOSIS  The evaluation by your caregiver will depend on the type of symptoms you are experiencing. The diagnosis of anxiety or panic attack is made when no physical illness can be determined to be a cause of the symptoms. TREATMENT  Treatment to prevent anxiety and panic attacks may include:  Avoidance of circumstances that cause anxiety.   Reassurance and relaxation.   Regular exercise.   Relaxation therapies, such as yoga.   Psychotherapy with a  psychiatrist or therapist.   Avoidance of caffeine, alcohol and illegal drugs.   Prescribed medication.  SEEK IMMEDIATE MEDICAL CARE IF:   You experience panic attack symptoms that are different than your usual symptoms.   You have any worsening or concerning symptoms.  Document Released: 04/16/2005 Document Revised: 04/05/2011 Document Reviewed: 08/18/2009 ExitCare Patient Information 2012 [employer].   RESOURCE GUIDE  Dental Problems  Patients with Medicaid: [facility]                     [facility] [address]                                           [address] Phone:  [phone]                                                  Phone:  [phone]  If unable to pay or uninsured, contact:  [facility] or [facility] to become qualified for the adult dental clinic.  Chronic Pain Problems Contact  [facility]  [phone] Patients need to be referred by their primary care doctor.  Insufficient Money for Medicine Contact United Way:  call "211" or [facility] 271-5999.    No Primary Care Doctor Call [facility]  832-8000 Other agencies that provide inexpensive medical care    [facility]  [phone]    [facility]  [phone]    [facility]  [phone]    [facility]  [phone]    [facility]  [phone]    [facility]  [phone]  Psychological Services [facility]  [phone] [facility]  [phone] [facility]   800 853-5163 (emergency services 641-4993)  Substance Abuse Resources [facility]  336-882-2125 [facility] 336-784-9470 [facility] 336-285-9073 [facility] 336-845-3988 Residential & Outpatient Substance Abuse Program  800-659-3381  Abuse/Neglect [place] Child Abuse Hotline (336) 641-3795 [place] Child Abuse Hotline 800-378-5315  (After Hours)  Emergency Shelter [facility] (336) 271-5985  M[facility] (336) 275-9566 [facility] (704) 372-4663  MRSA Hotline #:   832-7006    [place] Resources  [facility] [place]     [employer]                          [facility] [address] [place]                       [address]      [address]  [place]                                                [place]                            [place] Phone:  [phone]                                   Phone:  [phone]                 Phone:  [phone]  [facility] Phone:  [phone]  [place] Child Abuse Hotline (336) 342-1394 (336) 342-3537 (After Hours)   

## 2011-09-27 NOTE — ED Notes (Addendum)
Pt alert and oriented x4. 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 first day back at work in a couple days. on Tuesday pt found out she is [redacted] weeks pregnant, pt has been upset about that. When ems arrived pt was on the floor. Staff reported she was lowered to the floor. Denies hitting her head. Pillows propped underneath head. At work Pt started having anxiety, respirations 50/ min. Lung sounds clear. O2 stats 100%. ems coached pt into slow deep breathing.  Denies hx of anxiety.

## 2011-09-27 NOTE — ED Provider Notes (Signed)
History     CSN: [id number]  Arrival date & time 09/27/11  4098   First MD Initiated Contact with Patient 09/27/11 [id number]      Chief Complaint  Patient presents with  . Panic Attack  . Anxiety    (Consider location/radiation/quality/duration/timing/severity/associated sxs/prior treatment) HPI  Patient brought by ambulance from her work with complaints of anxiety and panic attacks. The patient is 21 years old and has 2 children already. She had a tubal ligation done and only 2 days ago found out that she was 7 weeks and 5 days pregnant. The pregnancy has been confirmed an IUP and ectopic has already been ruled out. The patient upon entering the exam room is crying and will not answer my questions. She does however confirm that she is not suicidal or homicidal. She denies having headache, chest pain, shortness of breath, abdominal pain, nausea, vomiting, diarrhea, denies cramping or vaginal bleeding. The patient only answers my questions with yes or no.   She is accompanied by her mother and husband who are at bedside with her. Her mother appears to be very concerned and say that she has never seen her like this. The husband does not appear to be concerned by the patients behavior.  Past Medical History  Diagnosis Date  . Hypertension     Past Surgical History  Procedure Date  . Cholecystectomy   . Tubal ligation     History reviewed. No pertinent family history.  History  Substance Use Topics  . Smoking status: Never Smoker   . Smokeless tobacco: Not on file  . Alcohol Use: No    OB History    Grav Para Term Preterm Abortions TAB SAB Ect Mult Living   4 2 2  0 1 0 1 0 0 2      Review of Systems   HEENT: denies blurry vision or change in hearing PULMONARY: Denies difficulty breathing and SOB CARDIAC: denies chest pain or heart palpitations MUSCULOSKELETAL:  denies being unable to ambulate ABDOMEN AL: denies abdominal pain GU: denies loss of bowel or urinary  control NEURO: denies numbness and tingling in extremities   Allergies  Review of patient's allergies indicates no known allergies.  Home Medications   Current Outpatient Rx  Name Route Sig Dispense Refill  . PRENATAL COMPLETE 14-0.4 MG PO TABS Oral Take 1 tablet by mouth daily. 60 each 0    BP 131/71  Pulse 80  Temp(Src) 98 F (36.7 C) (Oral)  Resp 18  SpO2 98%  LMP 07/26/2011  Physical Exam  Nursing note and vitals reviewed. Constitutional: She appears well-developed and well-nourished. No distress.  HENT:  Head: Normocephalic and atraumatic.  Eyes: Pupils are equal, round, and reactive to light.  Neck: Normal range of motion. Neck supple.  Cardiovascular: Normal rate and regular rhythm.   Pulmonary/Chest: Effort normal.  Abdominal: Soft. She exhibits no distension. There is no tenderness. There is no rebound.  Neurological: She is alert.  Skin: Skin is warm and dry.  Psychiatric: Her speech is normal. Judgment normal. Her mood appears anxious. Her affect is not angry, not blunt, not labile and not inappropriate. She is withdrawn. Cognition and memory are normal. She exhibits a depressed mood. She expresses no homicidal and no suicidal ideation. She expresses no suicidal plans and no homicidal plans.    ED Course  Procedures (including critical care time)  Labs Reviewed - No data to display US Ob Comp Less 14 Wks  09/25/2011  *RADIOLOGY REPORT*  Clinical  Data: Unsure of dates  OBSTETRIC <14 WK ULTRASOUND  Technique:  Transabdominal ultrasound was performed for evaluation of the gestation as well as the maternal uterus and adnexal regions.  Comparison:  None.  Intrauterine gestational sac: Single Yolk sac: Visualized Embryo: Visualized Cardiac Activity: Visualized Heart Rate: 150  bpm  CRL:  1.4 mm  7w  5d          [place] EDC: 05/08/2012  Maternal uterus/Adnexae: The uterus is anteverted.  No free fluid within the pelvis.  Right ovary - normal in size measuring 2.9 x 1.5 x 2.1  cm.  No discrete right-sided adnexal mass.  Left ovary - normal in size measuring 3.6 x 2.2 x 1.9 cm.  No discrete left-sided adnexal mass.  IMPRESSION: Single, viable intrauterine gestation with crown rump length compatible with 7 weeks, 5 days and an estimated delivery date of 05/08/2012.  A detailed anatomic survey at 18 to 21 weeks is recommended.  Original Report Authenticated By: Waynard Reeds, M.D.     1. Anxiety attack   2. Pregnant       MDM  I have consulted with the ACT team who has agreed to come offer support to the patient. She will provide patient with support groups as well. Pt given dose of Benadryl in ED for anxiety. She is to follow-up with her OB/Gyn to discuss the need for long term therapy.   After reassessment - the patient states she is feeling a bit better. She tells me that she is overwhelmed. She was trying to get her career on track and be able to provide for the two kids she has now. She tells me that she feels like she is going to be okay and that she is going to make it. Her father is in the room as well and offers her support and she is receptive. ACT team gave patient resources. I am giving the patient a couple of days off work. Also wrote and Rx for Prenatal vitamins.  Pt has been advised of the symptoms that warrant their return to the ED. Patient has voiced understanding and has agreed to follow-up with the PCP or specialist.        Dorthula Matas, PA 09/27/11 [phone]

## 2011-09-27 NOTE — BHH Counselor (Signed)
Writer provided patient with referrals for pregnancy support services, counseling, etc per request of Tiffany (EDP PA). Patient agreed to follow up with referrals accordingly. Patient discharged home.

## 2011-09-27 NOTE — ED Notes (Signed)
WUJ:[id number]<JY> Expected date:09/27/11<BR> Expected time: 7:35 AM<BR> Means of arrival:Ambulance<BR> Comments:<BR> Anxiety/pregnant

## 2011-11-07 ENCOUNTER — Encounter: Payer: Self-pay | Admitting: Family Medicine

## 2011-11-07 ENCOUNTER — Other Ambulatory Visit: Payer: Self-pay | Admitting: Family Medicine

## 2011-11-07 ENCOUNTER — Ambulatory Visit (INDEPENDENT_AMBULATORY_CARE_PROVIDER_SITE_OTHER): Payer: Self-pay | Admitting: Family Medicine

## 2011-11-07 VITALS — BP 117/82 | HR 82 | Temp 98.3°F | Wt 274.0 lb

## 2011-11-07 DIAGNOSIS — R51 Headache: Secondary | ICD-10-CM

## 2011-11-07 DIAGNOSIS — Z3490 Encounter for supervision of normal pregnancy, unspecified, unspecified trimester: Secondary | ICD-10-CM

## 2011-11-07 DIAGNOSIS — Z3480 Encounter for supervision of other normal pregnancy, unspecified trimester: Secondary | ICD-10-CM

## 2011-11-07 DIAGNOSIS — R519 Headache, unspecified: Secondary | ICD-10-CM | POA: Insufficient documentation

## 2011-11-07 NOTE — Assessment & Plan Note (Signed)
Headache likely secondary to stress. Encouraged her to take time for herself and to find ways to relieve her stress. She can take Tylenol if needed, but avoid all NSAIDs.

## 2011-11-07 NOTE — Progress Notes (Signed)
Subjective:     Patient ID: Kendra [name], female   DOB: 04-Jun-1990, 21 y.o.   MRN: [id number]  HPI  Pregnancy- Patient found to be pregnant in May 2013 s/p BTL. Here today for follow-up. Needs new OB visit, and all initial OB labs. Currently at [redacted]w[redacted]d. Overall feeling well today. No nausea, vomiting, anxiety. She does endorse HA especially with stress. BP 117/82. FHT doppler today 150's. Patient is happy about this pregnancy.  Headache- HA with stress. No changes in vision, leg swelling, abd pain. Left sided headache that does not radiate. No photophobia. Improves with rest. Has not tried any medication for it because she was unsure what to take  History reviewed- Non-smoker  Review of Systems See HPI above    Objective:   Physical Exam  Constitutional: She is oriented to person, place, and time. She appears well-developed and well-nourished. No distress.  HENT:  Head: Normocephalic and atraumatic.  Cardiovascular: Normal rate, regular rhythm and normal heart sounds.   No murmur heard. Pulmonary/Chest: Effort normal and breath sounds normal. She has no wheezes.  Abdominal: Soft. There is no tenderness.       Obese  Musculoskeletal: Normal range of motion. She exhibits no edema.  Neurological: She is alert and oriented to person, place, and time.   FHT doppler- 150's today    Assessment:   21 yo G4P2012 at [redacted]w[redacted]d presenting for non-OB follow-up appointment    Plan:

## 2011-11-07 NOTE — Patient Instructions (Addendum)
It was good to see you today. Congratulations!  For your headache, you can take plain Tylenol. No aspirin, aleve, advil or motrin.  Please schedule your "New OB" visit on your way out, with me if possible.  Aleanna Menge M. Kento Gossman, M.D.

## 2011-11-07 NOTE — Assessment & Plan Note (Addendum)
Patient was not scheduled for OB visit today. Will collect prenatal labs today, and schedule her for initial OB visit to obtain full OB history. Patient is excited about this pregnancy. Will return to clinic in 1-2 weeks for initial OB visit. Continue taking prenatal vitamins

## 2011-11-08 LAB — OBSTETRIC PANEL
Basophils Absolute: 0.1 10*3/uL (ref 0.0–0.1)
Eosinophils Absolute: 0.2 10*3/uL (ref 0.0–0.7)
Eosinophils Relative: 2 % (ref 0–5)
Hepatitis B Surface Ag: NEGATIVE
Lymphocytes Relative: 25 % (ref 12–46)
MCV: 85.7 fL (ref 78.0–100.0)
Neutrophils Relative %: 65 % (ref 43–77)
Platelets: 326 10*3/uL (ref 150–400)
RDW: 13.6 % (ref 11.5–15.5)
WBC: 8.5 10*3/uL (ref 4.0–10.5)

## 2011-11-08 LAB — SICKLE CELL SCREEN: Sickle Cell Screen: NEGATIVE

## 2011-11-08 LAB — HIV ANTIBODY (ROUTINE TESTING W REFLEX): HIV: NONREACTIVE

## 2011-11-09 LAB — CULTURE, OB URINE: Colony Count: NO GROWTH

## 2011-11-16 ENCOUNTER — Encounter: Payer: Self-pay | Admitting: Family Medicine

## 2011-11-16 ENCOUNTER — Ambulatory Visit (INDEPENDENT_AMBULATORY_CARE_PROVIDER_SITE_OTHER): Payer: Self-pay | Admitting: Family Medicine

## 2011-11-16 ENCOUNTER — Other Ambulatory Visit (HOSPITAL_COMMUNITY)
Admission: RE | Admit: 2011-11-16 | Discharge: 2011-11-16 | Disposition: A | Discharge status: Home / Self Care | Payer: Self-pay | Source: Ambulatory Visit | Attending: Family Medicine | Admitting: Family Medicine

## 2011-11-16 VITALS — BP 123/79 | Temp 98.0°F | Wt 272.0 lb

## 2011-11-16 DIAGNOSIS — Z01419 Encounter for gynecological examination (general) (routine) without abnormal findings: Secondary | ICD-10-CM | POA: Insufficient documentation

## 2011-11-16 DIAGNOSIS — Z3480 Encounter for supervision of other normal pregnancy, unspecified trimester: Secondary | ICD-10-CM

## 2011-11-16 NOTE — Patient Instructions (Addendum)
It was great to see you today. Everything looks great  Please schedule an appointment in 4 weeks. We will do a sugar test that day and we will schedule your anatomy ultrasound.  Kendra [name] M. Kendra [name], M.D.

## 2011-11-17 NOTE — Progress Notes (Signed)
   Subjective:   Kendra [name] is a [id number] [redacted]w[redacted]d being seen today for her first obstetrical visit.  Her obstetrical history is significant for obesity and prior BTL. Patient unsure if she intends to breast feed. She was initially upset over this pregnancy given her tubal ligation, but she is currently very happy. Pregnancy history fully reviewed. Patient has no concerns today  Patient reports no complaints.  Filed Vitals:   11/16/11 1558  BP: 123/79  Temp: 98 F (36.7 C)  Weight: 272 lb (123.378 kg)   HISTORY: OB History    Grav Para Term Preterm Abortions TAB SAB Ect Mult Living   4 2 2  0 1 0 1 0 0 2     # Outc Date GA Lbr Len/2nd Wgt Sex Del Anes PTL Lv   1 TRM 5/10   6lb2oz(2.778kg) M SVD EPI  Yes   2 TRM 4/12 [redacted]w[redacted]d 00:00  F SVD EPI  Yes   3 SAB            4 CUR              Past Medical History  Diagnosis Date  . Hypertension    Past Surgical History  Procedure Date  . Cholecystectomy   . Tubal ligation    History reviewed. No pertinent family history.  Exam  FHT noted by doppler- 150's  Uterus:   Enlarged on bimanual exam.  Pelvic Exam:    Perineum: No Hemorrhoids   Vulva: normal   Vagina:  normal mucosa   pH: deferred   Cervix: no cervical motion tenderness and no lesions   Adnexa: normal adnexa   Bony Pelvis:    System: Breast:  normal appearance, no masses or tenderness   Skin: normal coloration and turgor, no rashes    Neurologic: normal   Extremities: normal strength, tone, and muscle mass   HEENT PERRLA   Mouth/Teeth mucous membranes moist, pharynx normal without lesions   Neck supple   Cardiovascular: regular rate and rhythm   Respiratory:  appears well, vitals normal, no respiratory distress, acyanotic, normal RR, ear and throat exam is normal, neck free of mass or lymphadenopathy, chest clear, no wheezing, crepitations, rhonchi, normal symmetric air entry   Abdomen: soft, non-tender; bowel sounds normal; no masses,  no organomegaly   Urinary: urethral meatus normal    Assessment:    Pregnancy: [id number] at [redacted]w[redacted]d by LMP + first trimester ultrasound Patient Active Problem List  Diagnosis  . OBESITY, NOS  . History of tubal ligation  . Headache  . Supervision of normal subsequent pregnancy     Plan:    Initial labs drawn prior to today's visit. All results reviewed. Pap smear collected today. Prenatal vitamins. (Taking Flintstones) Problem list reviewed and updated. Genetic Screening discussed First Screen: declined. Ultrasound discussed; fetal survey: ordered. Will have at [redacted] weeks gestation  Follow up in 4 weeks. Will need early glucola at that time.

## 2011-11-19 ENCOUNTER — Encounter: Payer: Self-pay | Admitting: Family Medicine

## 2011-11-19 NOTE — Progress Notes (Signed)
Note reviewed.  Agree with plan.   Obesity - needs early glucola.  Would suggest separate lab visit. H/o BTL - IUP confirmed by ultrasound.

## 2011-11-29 ENCOUNTER — Encounter (HOSPITAL_COMMUNITY): Payer: Self-pay | Admitting: *Deleted

## 2011-11-29 ENCOUNTER — Inpatient Hospital Stay (HOSPITAL_COMMUNITY)
Admission: AD | Admit: 2011-11-29 | Discharge: 2011-11-29 | Disposition: A | Discharge status: Home / Self Care | Payer: Self-pay | Source: Ambulatory Visit | Attending: Obstetrics & Gynecology | Admitting: Obstetrics & Gynecology

## 2011-11-29 DIAGNOSIS — G44209 Tension-type headache, unspecified, not intractable: Secondary | ICD-10-CM | POA: Insufficient documentation

## 2011-11-29 DIAGNOSIS — O99891 Other specified diseases and conditions complicating pregnancy: Secondary | ICD-10-CM | POA: Insufficient documentation

## 2011-11-29 DIAGNOSIS — R51 Headache: Secondary | ICD-10-CM

## 2011-11-29 MED ORDER — HYDROCODONE-ACETAMINOPHEN 5-325 MG PO TABS
1.0000 | ORAL_TABLET | Freq: Once | ORAL | Status: AC
  Administered 2011-11-29: 1 via ORAL
  Filled 2011-11-29: qty 1

## 2011-11-29 NOTE — MAU Provider Note (Signed)
Attestation of Attending Supervision of Advanced Practitioner (CNM/NP): Evaluation and management procedures were performed by the Advanced Practitioner under my supervision and collaboration.  I have reviewed the Advanced Practitioner's note and chart, and I agree with the management and plan.  Byanca Kasper, MD, FACOG Attending Obstetrician & Gynecologist Faculty Practice, [facility], [place]  

## 2011-11-29 NOTE — MAU Provider Note (Addendum)
History     CSN: [id number]  Arrival date and time: 11/29/11 1829   None     Chief Complaint  Patient presents with  . Headache   HPI Kendra [name] is a 21 y.o. female @ 107w0d gestation who presents to MAU for headache. The headache started 2 weeks ago. She describes the headache as a throbbing pain that is constant for the past 2 days. The headache is located in the left temporal area and radiates to the left eye and ear and jaw. She rates the pain as 10/10. Took tylenol yesterday for pain approximately 10 pm without relief. The history was provided by the patient.  OB History    Grav Para Term Preterm Abortions TAB SAB Ect Mult Living   4 2 2  0 1 0 1 0 0 2      Past Medical History  Diagnosis Date  . Hypertension     Past Surgical History  Procedure Date  . Cholecystectomy   . Tubal ligation     No family history on file.  History  Substance Use Topics  . Smoking status: Never Smoker   . Smokeless tobacco: Not on file  . Alcohol Use: No    Allergies: No Known Allergies  Prescriptions prior to admission  Medication Sig Dispense Refill  . acetaminophen (TYLENOL) 325 MG tablet Take 325 mg by mouth every 6 (six) hours as needed.       . Pediatric Multivit-Minerals-C (FLINTSTONES GUMMIES PLUS) CHEW Chew 2 capsules by mouth daily.        Review of Systems  Constitutional: Negative for fever, chills and weight loss.  HENT: Positive for ear pain. Negative for nosebleeds, congestion, sore throat and neck pain.   Eyes: Negative for blurred vision, double vision, photophobia and pain.  Respiratory: Positive for shortness of breath. Negative for cough and wheezing.   Cardiovascular: Negative for chest pain, palpitations and leg swelling.  Gastrointestinal: Negative for heartburn, nausea, vomiting, abdominal pain, diarrhea and constipation.  Genitourinary: Negative for dysuria, urgency and frequency.  Musculoskeletal: Negative for myalgias and back pain.  Skin:  Negative for itching and rash.  Neurological: Positive for dizziness and headaches. Negative for sensory change, speech change, seizures and weakness.  Endo/Heme/Allergies: Does not bruise/bleed easily.  Psychiatric/Behavioral: Negative for depression. The patient is not nervous/anxious and does not have insomnia.    Physical Exam   Blood pressure 124/72, pulse 87, resp. rate 16, height 5\' 7"  (1.702 m), weight 273 lb 8 oz (124.059 kg), last menstrual period 07/26/2011, SpO2 99.00%, not currently breastfeeding.  Physical Exam  Constitutional: She is oriented to person, place, and time. She appears well-developed and well-nourished. No distress.  HENT:  Head: Normocephalic and atraumatic.  Eyes: Pupils are equal, round, and reactive to light. Right eye exhibits nystagmus. Left eye exhibits nystagmus.  Neck: Neck supple.  Cardiovascular: Normal rate, regular rhythm and normal heart sounds.   Respiratory: Effort normal and breath sounds normal.  GI: Soft. There is no tenderness.       Positive FHT's  Musculoskeletal: Normal range of motion. She exhibits no edema and no tenderness.       Radial and pedal pulses present. Adequate circulation.  Neurological: She is alert and oriented to person, place, and time. She has normal strength and normal reflexes. No cranial nerve deficit or sensory deficit. She displays a negative Romberg sign. Coordination and gait normal.  Skin: Skin is warm and dry.  Psychiatric: She has a normal mood and  affect. Her behavior is normal. Judgment and thought content normal.   Assessment: 21 y.o. female @ [redacted]w[redacted]d gestation with Headache  Plan:  Hydrocodone Ace 5/325 now MAU Course: Care turned over to [facility] @ 20:05 pm  Procedures  NEESE,HOPE, RN, FNP, [facility] 11/29/2011, 7:35 PM    Pt had significant relief of pain with single dose of hydrocodone.  She does not desire prescription for pain treatment, but will follow up in clinic if headaches  persist.   Assessment: Tension headache  Plan: D/C home Discussed increase in PO fluids, Tylenol for pain F/U in [facility] Return to MAU as needed.  Sharen Counter Certified Nurse-Midwife

## 2011-11-29 NOTE — MAU Note (Signed)
Pt states intermittent headaches x2 weeks, however in last 2 days, headache has worsened. Pain on left side only. Took tylenol yesterday with no relief. Denies abnormal vaginal d/c or bleeding.

## 2011-11-30 NOTE — MAU Provider Note (Signed)
Attestation of Attending Supervision of Advanced Practitioner (CNM/NP): Evaluation and management procedures were performed by the Advanced Practitioner under my supervision and collaboration.  I have reviewed the Advanced Practitioner's note and chart, and I agree with the management and plan.  Stormi Vandevelde, MD, FACOG Attending Obstetrician & Gynecologist Faculty Practice, [facility], [place]  

## 2011-12-11 ENCOUNTER — Ambulatory Visit (HOSPITAL_COMMUNITY): Payer: Self-pay

## 2011-12-13 ENCOUNTER — Ambulatory Visit (HOSPITAL_COMMUNITY)
Admission: RE | Admit: 2011-12-13 | Discharge: 2011-12-13 | Disposition: A | Discharge status: Home / Self Care | Payer: Self-pay | Source: Ambulatory Visit | Attending: Family Medicine | Admitting: Family Medicine

## 2011-12-13 DIAGNOSIS — Z1389 Encounter for screening for other disorder: Secondary | ICD-10-CM | POA: Insufficient documentation

## 2011-12-13 DIAGNOSIS — O358XX0 Maternal care for other (suspected) fetal abnormality and damage, not applicable or unspecified: Secondary | ICD-10-CM | POA: Insufficient documentation

## 2011-12-13 DIAGNOSIS — Z363 Encounter for antenatal screening for malformations: Secondary | ICD-10-CM | POA: Insufficient documentation

## 2011-12-13 DIAGNOSIS — O99210 Obesity complicating pregnancy, unspecified trimester: Secondary | ICD-10-CM | POA: Insufficient documentation

## 2011-12-13 DIAGNOSIS — E669 Obesity, unspecified: Secondary | ICD-10-CM | POA: Insufficient documentation

## 2011-12-13 DIAGNOSIS — Z3480 Encounter for supervision of other normal pregnancy, unspecified trimester: Secondary | ICD-10-CM

## 2012-01-03 ENCOUNTER — Ambulatory Visit (INDEPENDENT_AMBULATORY_CARE_PROVIDER_SITE_OTHER): Payer: Self-pay | Admitting: Family Medicine

## 2012-01-03 VITALS — BP 138/85 | Temp 98.6°F | Wt 277.0 lb

## 2012-01-03 DIAGNOSIS — R51 Headache: Secondary | ICD-10-CM

## 2012-01-03 DIAGNOSIS — Z3480 Encounter for supervision of other normal pregnancy, unspecified trimester: Secondary | ICD-10-CM

## 2012-01-03 DIAGNOSIS — Z331 Pregnant state, incidental: Secondary | ICD-10-CM

## 2012-01-03 LAB — POCT UA - GLUCOSE/PROTEIN: Glucose, UA: NEGATIVE

## 2012-01-03 MED ORDER — KETOROLAC TROMETHAMINE 30 MG/ML IJ SOLN
30.0000 mg | Freq: Once | INTRAMUSCULAR | Status: AC
  Administered 2012-01-03: 30 mg via INTRAMUSCULAR

## 2012-01-03 NOTE — Progress Notes (Signed)
21 yo G4P2012 at 22 weeks presenting for migraine-like headache. Reports headache from ear, down face and down neck on left side. Now spreading to right side. Had HA with elevated BP in the past but no history of migraines. Was seen at MAU on Aug 1 for the same thing. Was told to take Tylenol which did help for a while. Does not like narcotic pain medication. Current HA was for 3 days. Nothing really makes it better or worse. Lights aggravate the HA but do not make it "worse." No skin sensitivities. She has been drinking more water to stay hydrated. Eats well. No changes in vision. No sensory changes. No focal weakness.   Takes Flintstones daily. No contractions, no bleeding. Feels baby move some.  PE: See flowsheet Gen: Appears uncomfortable, in dim room HEENT: TTP of left neck. No LAD, no tooth abscess, TM's normal. Throat clear. Abd: Gravid Neuro: Grossly intact. No focal CN or other finding  A/P:  - Checked urine protein/glucose today given HA and slightly elevated BP. Both negative. - For HA, no focal neuro findings. Discussed with Dr. Deirdre Priest. OK to give Toradol during 2nd trimester. Patient does not like narcotics and Tylenol is not working. Will give Toradol 30mg  shot in the clinic now. Will continue to encourage symptomatic relief with dark rooms, heating pads and rest. RTC if HA worsens or does not resolve.  - Continue Flintstones - RTC in 4 weeks. Will need Glucola testing, which was not done today.

## 2012-01-03 NOTE — Patient Instructions (Signed)
I am sorry your head hurts. Continue to try Tylenol. You can also use a heat pack on your face, if you think that will help.  If your headache is not gone by Monday, please call us.  Otherwise, I will see you in 4 weeks for your next OB follow up. Atticus Lemberger M. Ayanna Gheen, M.D.

## 2012-01-03 NOTE — Assessment & Plan Note (Signed)
No focal neuro findings. Discussed with Dr. Deirdre Priest. OK to give Toradol during 2nd trimester. Patient does not like narcotics and Tylenol is not working. Will give Toradol 30mg  shot in the clinic now. Will continue to encourage symptomatic relief with dark rooms, heating pads and rest. RTC if HA worsens or does not resolve.

## 2012-01-14 ENCOUNTER — Encounter (HOSPITAL_COMMUNITY): Payer: Self-pay | Admitting: *Deleted

## 2012-01-14 ENCOUNTER — Inpatient Hospital Stay (HOSPITAL_COMMUNITY)
Admission: AD | Admit: 2012-01-14 | Discharge: 2012-01-14 | Disposition: A | Discharge status: Home / Self Care | Payer: Self-pay | Source: Ambulatory Visit | Attending: Family Medicine | Admitting: Family Medicine

## 2012-01-14 DIAGNOSIS — O99891 Other specified diseases and conditions complicating pregnancy: Secondary | ICD-10-CM | POA: Insufficient documentation

## 2012-01-14 DIAGNOSIS — R030 Elevated blood-pressure reading, without diagnosis of hypertension: Secondary | ICD-10-CM | POA: Insufficient documentation

## 2012-01-14 DIAGNOSIS — R51 Headache: Secondary | ICD-10-CM | POA: Insufficient documentation

## 2012-01-14 HISTORY — DX: Headache: R51

## 2012-01-14 LAB — URINALYSIS, DIPSTICK ONLY
Ketones, ur: NEGATIVE mg/dL
Leukocytes, UA: NEGATIVE
Nitrite: NEGATIVE
Protein, ur: NEGATIVE mg/dL
Urobilinogen, UA: 1 mg/dL (ref 0.0–1.0)

## 2012-01-14 MED ORDER — METOCLOPRAMIDE HCL 5 MG/ML IJ SOLN
10.0000 mg | Freq: Once | INTRAMUSCULAR | Status: AC
  Administered 2012-01-14: 10 mg via INTRAMUSCULAR
  Filled 2012-01-14: qty 2

## 2012-01-14 MED ORDER — DIPHENHYDRAMINE HCL 50 MG/ML IJ SOLN
25.0000 mg | Freq: Once | INTRAMUSCULAR | Status: AC
  Administered 2012-01-14: 25 mg via INTRAMUSCULAR
  Filled 2012-01-14: qty 1

## 2012-01-14 MED ORDER — DEXAMETHASONE SODIUM PHOSPHATE 10 MG/ML IJ SOLN
10.0000 mg | Freq: Once | INTRAMUSCULAR | Status: AC
  Administered 2012-01-14: 10 mg via INTRAMUSCULAR
  Filled 2012-01-14: qty 1

## 2012-01-14 NOTE — MAU Provider Note (Signed)
Chief Complaint:  Headache   First Provider Initiated Contact with Patient 01/14/12 2127      HPI: Kendra [name] is a 21 y.o. [id number] at [redacted]w[redacted]d who presents to maternity admissions reporting migraine-like headache.   Headache has been present on and off since August first. Always there about a 3/10 but then at times is very strong. Today it is a 10/10. Pain is a constant ache from left ear, down face and neck and up into forehead. No vision changes, nausea, photo/phonophobia.  Seen in MAU 8/1 and put on scheduled tylenol for it but that doesn't help. Went to family practice for the headache and was given 30mg  IM toradol with slight relief but it returned later the same day.  Nothing seems to really make a difference.  She is eating and drinking well. No focal weakenss or sensory changes.      Takes Flintstones daily. No contractions, no bleeding. Feels baby move some.  Pregnancy Course:   Past Medical History: Past Medical History  Diagnosis Date  . Hypertension   . Headache     Past obstetric history: OB History    Grav Para Term Preterm Abortions TAB SAB Ect Mult Living   4 2 2  0 1 0 1 0 0 2     # Outc Date GA Lbr Len/2nd Wgt Sex Del Anes PTL Lv   1 TRM 5/10   6lb2oz(2.778kg) M SVD EPI  Yes   2 TRM 4/12 [redacted]w[redacted]d 00:00  F SVD EPI  Yes   3 SAB            4 CUR               Past Surgical History: Past Surgical History  Procedure Date  . Cholecystectomy   . Tubal ligation     Family History: Family History  Problem Relation Age of Onset  . Asthma Maternal Grandmother   . COPD Maternal Grandmother     Social History: History  Substance Use Topics  . Smoking status: Never Smoker   . Smokeless tobacco: Not on file  . Alcohol Use: No    Allergies: No Known Allergies  Meds:  Prescriptions prior to admission  Medication Sig Dispense Refill  . acetaminophen (TYLENOL) 325 MG tablet Take 325 mg by mouth every 6 (six) hours as needed. For pain      . Pediatric  Multivit-Minerals-C (FLINTSTONES GUMMIES PLUS) CHEW Chew 2 capsules by mouth daily.        ROS: Pertinent findings in history of present illness.  Physical Exam  Blood pressure 129/75, pulse 85, temperature 98.1 F (36.7 C), temperature source Oral, resp. rate 18, height 5\' 8"  (1.727 m), weight 127.517 kg (281 lb 2 oz), last menstrual period 07/26/2011, unknown if currently breastfeeding. GENERAL: Well-developed, well-nourished female in no acute distress, tearful HEENT: normocephalic HEART: normal rate RESP: normal effort ABDOMEN: Soft, non-tender, gravid appropriate for gestational age EXTREMITIES: Nontender, no edema NEURO: alert and oriented, CN II-XII intact, 5/5 strength, normal sensation     FHT:  Baseline 140s Contractions: quiet   Labs: Results for orders placed during the hospital encounter of 01/14/12 (from the past 24 hour(s))  URINALYSIS, DIPSTICK ONLY     Status: Normal   Collection Time   01/14/12  8:40 PM      Component Value Range   Specific Gravity, Urine 1.020  1.005 - 1.030   pH 6.0  5.0 - 8.0   Glucose, UA NEGATIVE  NEGATIVE mg/dL  Hgb urine dipstick NEGATIVE  NEGATIVE   Bilirubin Urine NEGATIVE  NEGATIVE   Ketones, ur NEGATIVE  NEGATIVE mg/dL   Protein, ur NEGATIVE  NEGATIVE mg/dL   Urobilinogen, UA 1.0  0.0 - 1.0 mg/dL   Nitrite NEGATIVE  NEGATIVE   Leukocytes, UA NEGATIVE  NEGATIVE    Imaging:  No results found. ED Course   Assessment: 1. Headache     Plan:  1) headache - tension like in story although chronically persistent and possibly atypical migraine. No red flag symptoms. Neuro exam normal.  - pt has now been seen 3 times without relief despite tylenol, toradol and other medications - trial of migraine cocktail here with IM benadryl, reglan and decadron - pt has a ride home and it was discussed this will make her sleepy   1 hour after migraine cocktail, pt's headache had resolved and she was requesting to go home.   2) elevated  BP - slightly elevated BP 130s/90s when in pain. Negative urine dipstick for protein.  - It appears her pressures have been elevated for most of her prenatal visits at family medicine with a range of SBP 110-140 and DBP 70-100 - discussed with pt that she will need to follow up with her primary doctor regarding her pressures and possible further workup - pt has a hx of HTN requiring meds but has been off them for several years   3) reassuring strip on baby  Discharge home Labor precautions and fetal kick counts     Follow-up Information    Follow up with [facility]. Schedule an appointment as soon as possible for a visit in 3 days. (for follow up of your headache and blood pressure)    Contact information:   [address] [place] [address] [phone]          Medication List     As of 01/14/2012 11:20 PM    TAKE these medications         acetaminophen 325 MG tablet   Commonly known as: TYLENOL   Take 325 mg by mouth every 6 (six) hours as needed. For pain      FLINTSTONES GUMMIES PLUS Chew   Chew 2 capsules by mouth daily.         Rulon Abide Medical Resident 01/14/2012 11:20 PM

## 2012-01-14 NOTE — MAU Note (Signed)
Complains HA frequently during this pregnancy, with most recent HA starting 3 days ago on left side of head. Has been unrelieved with tylenol. Has seen PCP and given shot for HA on 01/03/2012. She is receiving both Primary and Obstetrical care through [facility].  Patient states some blurry vision, no floaters, denies right upper quadrant pain.

## 2012-01-15 NOTE — MAU Provider Note (Signed)
I have seen and examined patient and agree with above.  Amanuel Sinkfield, MD 

## 2012-01-18 ENCOUNTER — Ambulatory Visit: Payer: Self-pay | Admitting: Family Medicine

## 2012-01-19 ENCOUNTER — Encounter (HOSPITAL_COMMUNITY): Payer: Self-pay | Admitting: *Deleted

## 2012-01-19 ENCOUNTER — Inpatient Hospital Stay (HOSPITAL_COMMUNITY)
Admission: AD | Admit: 2012-01-19 | Discharge: 2012-01-19 | Disposition: A | Discharge status: Home / Self Care | Payer: Self-pay | Source: Ambulatory Visit | Attending: Obstetrics & Gynecology | Admitting: Obstetrics & Gynecology

## 2012-01-19 DIAGNOSIS — R109 Unspecified abdominal pain: Secondary | ICD-10-CM

## 2012-01-19 DIAGNOSIS — R51 Headache: Secondary | ICD-10-CM

## 2012-01-19 DIAGNOSIS — A499 Bacterial infection, unspecified: Secondary | ICD-10-CM

## 2012-01-19 DIAGNOSIS — O26859 Spotting complicating pregnancy, unspecified trimester: Secondary | ICD-10-CM

## 2012-01-19 DIAGNOSIS — N760 Acute vaginitis: Secondary | ICD-10-CM

## 2012-01-19 DIAGNOSIS — E669 Obesity, unspecified: Secondary | ICD-10-CM

## 2012-01-19 DIAGNOSIS — O209 Hemorrhage in early pregnancy, unspecified: Secondary | ICD-10-CM | POA: Insufficient documentation

## 2012-01-19 LAB — URINE MICROSCOPIC-ADD ON

## 2012-01-19 LAB — URINALYSIS, ROUTINE W REFLEX MICROSCOPIC
Bilirubin Urine: NEGATIVE
Ketones, ur: NEGATIVE mg/dL
Nitrite: NEGATIVE
Protein, ur: NEGATIVE mg/dL
Urobilinogen, UA: 1 mg/dL (ref 0.0–1.0)

## 2012-01-19 LAB — WET PREP, GENITAL

## 2012-01-19 MED ORDER — BUTALBITAL-APAP-CAFFEINE 50-325-40 MG PO TABS
2.0000 | ORAL_TABLET | Freq: Once | ORAL | Status: AC
  Administered 2012-01-19: 2 via ORAL
  Filled 2012-01-19: qty 2

## 2012-01-19 MED ORDER — METRONIDAZOLE 500 MG PO TABS: 500.0000 mg | ORAL_TABLET | Freq: Two times a day (BID) | ORAL | Status: AC

## 2012-01-19 NOTE — MAU Provider Note (Signed)
History     CSN: [id number]  Arrival date and time: 01/19/12 1159 Patient is a 21 y.o. [id number] at [redacted]w[redacted]d here for spotting x 2 episodes.  Also has chronic headache, helped with cocktail of medications during her last visit.  Denies contractions, LOF.  Endorses adequate FM. Followed at [facility].   Chief Complaint  Patient presents with  . Vaginal Bleeding   HPI  OB History    Grav Para Term Preterm Abortions TAB SAB Ect Mult Living   4 2 2  0 1 0 1 0 0 2      Past Medical History  Diagnosis Date  . Hypertension   . Headache     Past Surgical History  Procedure Date  . Cholecystectomy   . Tubal ligation     Family History  Problem Relation Age of Onset  . Asthma Maternal Grandmother   . COPD Maternal Grandmother     History  Substance Use Topics  . Smoking status: Never Smoker   . Smokeless tobacco: Not on file  . Alcohol Use: No    Allergies: No Known Allergies  Prescriptions prior to admission  Medication Sig Dispense Refill  . acetaminophen (TYLENOL) 325 MG tablet Take 325 mg by mouth every 6 (six) hours as needed. For pain      . Pediatric Multivit-Minerals-C (FLINTSTONES GUMMIES PLUS) CHEW Chew 2 capsules by mouth daily.        Review of Systems  All other systems reviewed and are negative.   Physical Exam   Blood pressure 128/65, pulse 76, temperature 98 F (36.7 C), temperature source Oral, resp. rate 18, height 5\' 8"  (1.727 m), weight 127.098 kg (280 lb 3.2 oz), last menstrual period 07/26/2011. FHR 145bpm, reassuring and appropriate for GA. No contractions on tocometer. Physical Exam  Constitutional: She is oriented to person, place, and time. She appears well-developed and well-nourished.  GI: Soft. She exhibits no distension. There is no tenderness.  Genitourinary: Uterus normal. Rectal exam shows no external hemorrhoid. Cervix exhibits no motion tenderness. Vaginal discharge found.       Cervix is closed/long and closed. No evidence of bleeding  noted in vaginal vault, white discharge seen in vagina. Wet prep, GC/Chlam probe obtained.  Musculoskeletal: Normal range of motion.  Neurological: She is alert and oriented to person, place, and time.   Results for orders placed during the hospital encounter of 01/19/12 (from the past 24 hour(s))  URINALYSIS, ROUTINE W REFLEX MICROSCOPIC     Status: Abnormal   Collection Time   01/19/12  1:10 PM      Component Value Range   Color, Urine YELLOW  YELLOW   APPearance CLEAR  CLEAR   Specific Gravity, Urine 1.025  1.005 - 1.030   pH 6.0  5.0 - 8.0   Glucose, UA NEGATIVE  NEGATIVE mg/dL   Hgb urine dipstick TRACE (*) NEGATIVE   Bilirubin Urine NEGATIVE  NEGATIVE   Ketones, ur NEGATIVE  NEGATIVE mg/dL   Protein, ur NEGATIVE  NEGATIVE mg/dL   Urobilinogen, UA 1.0  0.0 - 1.0 mg/dL   Nitrite NEGATIVE  NEGATIVE   Leukocytes, UA NEGATIVE  NEGATIVE  URINE MICROSCOPIC-ADD ON     Status: Abnormal   Collection Time   01/19/12  1:10 PM      Component Value Range   Squamous Epithelial / LPF FEW (*) RARE   Bacteria, UA FEW (*) RARE   Urine-Other MUCOUS PRESENT    WET PREP, GENITAL     Status: Abnormal  Collection Time   01/19/12  2:25 PM      Component Value Range   Yeast Wet Prep HPF POC NONE SEEN  NONE SEEN   Trich, Wet Prep NONE SEEN  NONE SEEN   Clue Cells Wet Prep HPF POC FEW (*) NONE SEEN   WBC, Wet Prep HPF POC FEW (*) NONE SEEN    MAU Course  Procedures  MDM GC/Chlam, wet prep ordered. Fioricet given for headache.  Assessment and Plan  No evidence of bleeding on exam Wet prep showed few clue cells and WBC, will treat for BV. GC/Chlam pending Follow up with primary OB Preterm labor precautions reviewed  Jaynie Collins, MD, FACOG Attending Obstetrician & Gynecologist Faculty Practice, [facility]

## 2012-01-19 NOTE — MAU Note (Signed)
Pt reports having some spotting yesterday and today mild pelvic pain/cramping and headache.

## 2012-01-19 NOTE — MAU Note (Signed)
Pt in c/o sharp lower abdominal pains, worse on left side, since last night.  Reports spotting x2 episodes last night, and one episode of spotting today.  Last intercourse 2 weeks ago.  Reports a severe headache that has been off and on since beginning of August, states it is all on left side and goes down into neck.  Was seen here for it in the past.  Denies any leaking of fluid, + FM.

## 2012-01-21 LAB — GC/CHLAMYDIA PROBE AMP, GENITAL: Chlamydia, DNA Probe: NEGATIVE

## 2012-01-22 ENCOUNTER — Ambulatory Visit (INDEPENDENT_AMBULATORY_CARE_PROVIDER_SITE_OTHER): Payer: Self-pay | Admitting: Family Medicine

## 2012-01-22 VITALS — BP 134/80 | Wt 278.0 lb

## 2012-01-22 DIAGNOSIS — R51 Headache: Secondary | ICD-10-CM

## 2012-01-22 DIAGNOSIS — Z3480 Encounter for supervision of other normal pregnancy, unspecified trimester: Secondary | ICD-10-CM

## 2012-01-22 MED ORDER — CYCLOBENZAPRINE HCL 5 MG PO TABS: 5.0000 mg | ORAL_TABLET | Freq: Three times a day (TID) | ORAL | Status: DC | PRN

## 2012-01-22 NOTE — Progress Notes (Signed)
21 yo [id number] at [redacted]w[redacted]d presenting for headaches. Pt has been evaluated multiple times for same complaint of left sided headaches that radiate down her neck and up into her scalp. She has been taking Tylenol 2 tabs q4 hours for many weeks now. Only rest helps. Nothing makes it better or worse. She has no focal neurologic findings and no rash. No skin sensitivities.  Good fetal movement. Some bleeding and some vaginal discharge. Dx with BV at [facility] last week, but not able to afford Flagyl at this time. Otherwise, no problems.  O: Evaluated with Dr. Leveda Anna. Patient has point tenderness to many facial muscles on left side only. No focal weakness.  A/P: Most likely secondary to muscle tension headaches although only one sided. - Wean from Tylenol as analgesic withdrawal could also contribute to pain - Flexeril as needed for pain, and only when she can sleep - Referral to outpatient PT for massage and stretching - RTC in 3 weeks for follow up and GTT.

## 2012-01-22 NOTE — Assessment & Plan Note (Signed)
Muscle tension. Will try flexeril and PT.

## 2012-01-22 NOTE — Patient Instructions (Signed)
I am sorry your head still hurts. I have sent in a prescription for Flexeril to take only at night or when you can rest. Start cutting back on your Tylenol. I will see you back in 3 weeks for your sugar test.  Come back if you need anything. Rawlins Stuard M. Maleka Contino, M.D.

## 2012-01-29 ENCOUNTER — Ambulatory Visit (INDEPENDENT_AMBULATORY_CARE_PROVIDER_SITE_OTHER): Payer: Self-pay | Admitting: Sports Medicine

## 2012-01-29 VITALS — BP 140/80 | Temp 98.0°F | Wt 280.7 lb

## 2012-01-29 DIAGNOSIS — R300 Dysuria: Secondary | ICD-10-CM

## 2012-01-29 LAB — POCT URINALYSIS DIPSTICK
Bilirubin, UA: NEGATIVE
Blood, UA: NEGATIVE
Ketones, UA: NEGATIVE
Spec Grav, UA: 1.025
pH, UA: 6

## 2012-01-29 LAB — COMPREHENSIVE METABOLIC PANEL
BUN: 6 mg/dL (ref 6–23)
CO2: 21 mEq/L (ref 19–32)
Calcium: 9.4 mg/dL (ref 8.4–10.5)
Chloride: 104 mEq/L (ref 96–112)
Creat: 0.54 mg/dL (ref 0.50–1.10)
Glucose, Bld: 71 mg/dL (ref 70–99)

## 2012-01-29 LAB — CBC
HCT: 37.3 % (ref 36.0–46.0)
Hemoglobin: 12.8 g/dL (ref 12.0–15.0)
WBC: 10.5 10*3/uL (ref 4.0–10.5)

## 2012-01-29 NOTE — Progress Notes (Signed)
21yo [id number] @ [redacted]w[redacted]d presenting for headaches and elevated BP.  Reports B frontal headache with maxillary pressure.  This is a different type of headache than she has had recently.  Started acutely this AM.  BP @ worked was noted to be 146/104 with pulse of 86.

## 2012-01-29 NOTE — Patient Instructions (Addendum)
It was great to see you.  You have a cold that is making your headaches worse.  Please continue tylenol and you may take benadryl (diphenhydramine) as needed for your facial pressure.  Please follow up tomorrow with your PCP.  We are checking your labs today.

## 2012-01-30 ENCOUNTER — Ambulatory Visit (INDEPENDENT_AMBULATORY_CARE_PROVIDER_SITE_OTHER): Payer: Self-pay | Admitting: Family Medicine

## 2012-01-30 ENCOUNTER — Ambulatory Visit: Payer: Medicaid Other | Attending: Family Medicine | Admitting: Rehabilitative and Restorative Service Providers"

## 2012-01-30 VITALS — BP 126/85 | Temp 98.1°F | Wt 281.0 lb

## 2012-01-30 DIAGNOSIS — Z3480 Encounter for supervision of other normal pregnancy, unspecified trimester: Secondary | ICD-10-CM

## 2012-01-30 LAB — PROTEIN / CREATININE RATIO, URINE
Creatinine, Urine: 283.8 mg/dL
Protein Creatinine Ratio: 0.02 (ref <0.15)
Total Protein, Urine: 7 mg/dL

## 2012-01-30 NOTE — Progress Notes (Signed)
21 yo at [redacted]w[redacted]d presenting for follow up appointment. HA improved with massage and decreased Tylenol use. When she is working she has headache and BP elevates. Works part time as a Programmer, applications two days per week. BP good today in the office. Pr:Cr ratio yesterday was wnl. Good fetal movement, no bleeding, no vaginal discharge, no LOF.  O: see flowsheet  A/P: - RTC in 2 weeks for BP recheck, 28 week labs and glucose test.

## 2012-01-30 NOTE — Patient Instructions (Addendum)
It was good to see you today. Everything looks good!  Please come back in 2 weeks for your sugar test, 28 week labs and blood pressure recheck.  Thanks! Amber M. Hairford, M.D.

## 2012-02-12 ENCOUNTER — Encounter: Payer: Self-pay | Admitting: Family Medicine

## 2012-02-12 ENCOUNTER — Inpatient Hospital Stay (HOSPITAL_COMMUNITY)
Admission: AD | Admit: 2012-02-12 | Discharge: 2012-02-12 | Disposition: A | Discharge status: Home / Self Care | Payer: Medicaid Other | Source: Ambulatory Visit | Attending: Obstetrics & Gynecology | Admitting: Obstetrics & Gynecology

## 2012-02-12 ENCOUNTER — Encounter (HOSPITAL_COMMUNITY): Payer: Self-pay

## 2012-02-12 DIAGNOSIS — N949 Unspecified condition associated with female genital organs and menstrual cycle: Secondary | ICD-10-CM

## 2012-02-12 DIAGNOSIS — R109 Unspecified abdominal pain: Secondary | ICD-10-CM | POA: Insufficient documentation

## 2012-02-12 DIAGNOSIS — R102 Pelvic and perineal pain: Secondary | ICD-10-CM

## 2012-02-12 DIAGNOSIS — O9989 Other specified diseases and conditions complicating pregnancy, childbirth and the puerperium: Secondary | ICD-10-CM

## 2012-02-12 DIAGNOSIS — O99891 Other specified diseases and conditions complicating pregnancy: Secondary | ICD-10-CM | POA: Insufficient documentation

## 2012-02-12 LAB — URINALYSIS, ROUTINE W REFLEX MICROSCOPIC
Hgb urine dipstick: NEGATIVE
Ketones, ur: NEGATIVE mg/dL
Leukocytes, UA: NEGATIVE
Protein, ur: NEGATIVE mg/dL
Urobilinogen, UA: 2 mg/dL — ABNORMAL HIGH (ref 0.0–1.0)

## 2012-02-12 MED ORDER — COMFORT FIT MATERNITY SUPP LG MISC: 1.0000 [IU] | Freq: Every day | Status: DC

## 2012-02-12 NOTE — MAU Note (Signed)
Started having lower abdominal pain since yesterday still having this morning, no vaginal bleeding or discharge, no dysuria.

## 2012-02-12 NOTE — MAU Note (Signed)
Patient given pitcher of water encourage to drink.

## 2012-02-12 NOTE — MAU Note (Signed)
Patient completed pitcher of water, notified [place] CNM

## 2012-02-12 NOTE — MAU Note (Signed)
Patient states her cramping went completely away after drinking the pitcher of water.

## 2012-02-12 NOTE — MAU Note (Signed)
Patient states she has had lower abdominal cramping since yesterday, getting worse and more frequent. Denies any bleeding. Does have an increase in normal discharge. Reports fetal movement.

## 2012-02-12 NOTE — MAU Provider Note (Signed)
Chief Complaint:  Abdominal Cramping   First Provider Initiated Contact with Patient 02/12/12 [phone]     HPI: Kendra [name] is a 21 y.o. [id number] at 42w5dwho presents to maternity admissions reporting constant lower abdominal cramping since yesterday. Denies vaginal bleeding, LOF or discharge, dysuria. Good fetal movement. Last IC ~24 hours ago.   Past Medical History: Past Medical History  Diagnosis Date  . Hypertension   . Headache     Past obstetric history: OB History    Grav Para Term Preterm Abortions TAB SAB Ect Mult Living   4 2 2  0 1 0 1 0 0 2     # Outc Date GA Lbr Len/2nd Wgt Sex Del Anes PTL Lv   1 TRM 5/10   6lb2oz(2.778kg) M SVD EPI  Yes   2 TRM 4/12 [redacted]w[redacted]d 00:00  F SVD EPI  Yes   3 SAB            4 CUR               Past Surgical History: Past Surgical History  Procedure Date  . Cholecystectomy   . Tubal ligation     Family History: Family History  Problem Relation Age of Onset  . Asthma Maternal Grandmother   . COPD Maternal Grandmother     Social History: History  Substance Use Topics  . Smoking status: Never Smoker   . Smokeless tobacco: Not on file  . Alcohol Use: No    Allergies: No Known Allergies  Meds:  Prescriptions prior to admission  Medication Sig Dispense Refill  . Pediatric Multivit-Minerals-C (FLINTSTONES GUMMIES PLUS) CHEW Chew 2 capsules by mouth daily.        ROS: Pertinent findings in history of present illness.  Physical Exam  Blood pressure 129/80, pulse 86, temperature 97.9 F (36.6 C), temperature source Oral, resp. rate 18, height 5\' 8"  (1.727 m), weight 129.275 kg (285 lb), last menstrual period 07/26/2011, SpO2 100.00%. GENERAL: Well-developed, well-nourished female in no acute distress.  HEENT: normocephalic HEART: normal rate RESP: normal effort ABDOMEN: Soft, non-tender, gravid appropriate for gestational age EXTREMITIES: Nontender, no edema NEURO: alert and oriented SPECULUM EXAM: NEFG, physiologic  discharge, no blood, cervix clean Dilation: Closed Effacement (%): Thick Cervical Position: Posterior Station: Ballotable Exam by:: [place] CNM  FHT:  Baseline 145 , moderate variability, accelerations present, no decelerations Contractions: None   Labs: Results for orders placed during the hospital encounter of 02/12/12 (from the past 24 hour(s))  URINALYSIS, ROUTINE W REFLEX MICROSCOPIC     Status: Abnormal   Collection Time   02/12/12  9:10 AM      Component Value Range   Color, Urine YELLOW  YELLOW   APPearance HAZY (*) CLEAR   Specific Gravity, Urine 1.025  1.005 - 1.030   pH 6.0  5.0 - 8.0   Glucose, UA NEGATIVE  NEGATIVE mg/dL   Hgb urine dipstick NEGATIVE  NEGATIVE   Bilirubin Urine SMALL (*) NEGATIVE   Ketones, ur NEGATIVE  NEGATIVE mg/dL   Protein, ur NEGATIVE  NEGATIVE mg/dL   Urobilinogen, UA 2.0 (*) 0.0 - 1.0 mg/dL   Nitrite NEGATIVE  NEGATIVE   Leukocytes, UA NEGATIVE  NEGATIVE    Imaging:  No results found. ED Course PO Hydration  Assessment: 1. Pain of round ligament complicating pregnancy, antepartum     Plan: Discharge home Preterm labor precautions and fetal kick counts Comfort measures.     Follow-up Information    [facility] (as scheduled)    Contact information:   [address] [place] [address]       Follow up with [facility] [place] [facility]. (As needed if symptoms worsen)    Contact information:   [address] [id number] mc [place] [address] [phone]          Medication List     As of 02/12/2012 10:30 AM    TAKE these medications         COMFORT FIT MATERNITY SUPP LG Misc   1 Units by Does not apply route daily.      FLINTSTONES GUMMIES PLUS Chew   Chew 2 capsules by mouth daily.         Kendra [name], CNM 02/12/2012 10:30 AM

## 2012-02-13 NOTE — MAU Provider Note (Signed)
Attestation of Attending Supervision of Advanced Practitioner (CNM/NP): Evaluation and management procedures were performed by the Advanced Practitioner under my supervision and collaboration.  I have reviewed the Advanced Practitioner's note and chart, and I agree with the management and plan.  Valin Massie, MD, FACOG Attending Obstetrician & Gynecologist Faculty Practice, [facility]  

## 2012-02-21 ENCOUNTER — Ambulatory Visit (INDEPENDENT_AMBULATORY_CARE_PROVIDER_SITE_OTHER): Payer: Self-pay | Admitting: Family Medicine

## 2012-02-21 VITALS — BP 111/78 | Wt 285.0 lb

## 2012-02-21 DIAGNOSIS — Z3480 Encounter for supervision of other normal pregnancy, unspecified trimester: Secondary | ICD-10-CM

## 2012-02-21 LAB — CBC
MCH: 30.2 pg (ref 26.0–34.0)
MCHC: 34.9 g/dL (ref 30.0–36.0)
Platelets: 333 10*3/uL (ref 150–400)
RDW: 13.5 % (ref 11.5–15.5)

## 2012-02-21 NOTE — Patient Instructions (Signed)
It was good to see you today.  Please come back in 3 weeks.  If you have pain, pressure, decreased fetal movement, or any other concerns please go to the MAU.  Baillie Mohammad M. Mckynzi Cammon, M.D.

## 2012-02-21 NOTE — Progress Notes (Signed)
21 yo at [redacted]w[redacted]d presenting for follow up Cramping and lower abd pain. Went to MAU and was encouraged to drink more water.  Still concerned about her job. Unable to work due to weakness, abdominal pain and back pain. Would like to start maternity leave. She goes into work and sometimes leaves early due to those conditions. Needs note for manager to get FMLA paperwork. Good fetal movement, no bleeding, no discharge or LOF.  HA has improved.   O: See flowsheet  A/P: - BP improved today. Continue to monitor - Con't taking vitamin - 1 hour GTT, CBC, HIV, RPR.  - After discussion with patient, she feels it would be safer for her to start her maternity leave. Will write note to job to explain that if patient feels it is best, she can start maternity leave given her elevated blood pressures in the past, headache, weight gain and new abdominal pain/pressure. - RTC in 4 weeks for follow up appointment

## 2012-02-22 LAB — HIV ANTIBODY (ROUTINE TESTING W REFLEX): HIV: NONREACTIVE

## 2012-03-13 ENCOUNTER — Ambulatory Visit (INDEPENDENT_AMBULATORY_CARE_PROVIDER_SITE_OTHER): Payer: Self-pay | Admitting: Family Medicine

## 2012-03-13 VITALS — BP 131/81 | Wt 290.0 lb

## 2012-03-13 DIAGNOSIS — Z3480 Encounter for supervision of other normal pregnancy, unspecified trimester: Secondary | ICD-10-CM

## 2012-03-13 MED ORDER — VALACYCLOVIR HCL 500 MG PO TABS: 500.0000 mg | ORAL_TABLET | Freq: Two times a day (BID) | ORAL | Status: AC

## 2012-03-13 NOTE — Patient Instructions (Signed)

## 2012-03-13 NOTE — Progress Notes (Signed)
Pt is a 21 yo G4P2012 at 32 weeks routine follow up Patient reports good fetal movement, no bleeding, no LOF, irregular contractions. Some thin vaginal discharge without odor or other concerns. Patient's biggest concern today is a rash that started 3 days ago. States approx 4-5 days ago felt tingling pain underneath right arm then a few days later noted a painful erythematous rash on her chest. It has since spread to her axilla, under arm and 2 patches on her upper back (scapula area.) Patient has not tried anything for the rash. She has never had anythign like this before. No oral or vaginal lesions. No weeping from the rash. No fevers, chills, URI symptoms.  O: See flow sheet Skin: Multiple vesicular lesions in T2 dermatone distribution. No oozing. Painful to touch.  A/P: - Patient appears to have shingles based on history and clinical exam. Will treat with Valtrex 500mg  BID until pain resolves. - RTC in 2 weeks to [facility] clinic, then back to see me in 2 weeks from then for 36 week labs. - Continue daily vitamin

## 2012-03-26 ENCOUNTER — Encounter: Payer: Self-pay | Admitting: Family Medicine

## 2012-04-14 ENCOUNTER — Ambulatory Visit (INDEPENDENT_AMBULATORY_CARE_PROVIDER_SITE_OTHER): Payer: Self-pay | Admitting: Family Medicine

## 2012-04-14 ENCOUNTER — Other Ambulatory Visit (HOSPITAL_COMMUNITY)
Admission: RE | Admit: 2012-04-14 | Discharge: 2012-04-14 | Disposition: A | Discharge status: Home / Self Care | Payer: Self-pay | Source: Ambulatory Visit | Attending: Family Medicine | Admitting: Family Medicine

## 2012-04-14 VITALS — BP 125/84 | Wt 300.0 lb

## 2012-04-14 DIAGNOSIS — Z113 Encounter for screening for infections with a predominantly sexual mode of transmission: Secondary | ICD-10-CM | POA: Insufficient documentation

## 2012-04-14 DIAGNOSIS — Z331 Pregnant state, incidental: Secondary | ICD-10-CM

## 2012-04-14 NOTE — Progress Notes (Signed)
Kendra [name] 21 y.o. female  [id number] at [redacted]w[redacted]d presenting for follow up.  Patient denies decreased fetal movement/changes in normal discharge/blood from vagina/rush of fluid. Does have irregular contractions sometimes up to once an hour.  Rash resolved from previous visit.  Taking prenatal vitamin.  Pediatrician Dr. Mikel Cella.  Patient plans to breastfeed. Patient refuses desire for prenatal classes.  Information on places for circumcision given.  Had BTL before this pregnancy. Discussed birth control. Patient asked if she should have repeat BTL. Discussed possible IUD patient but deferred further discussion to Dr. Mikel Cella. Called patient after visit to have her come in for nurse visit to sign tubal papers in case that is the option decided upon.  Patient would benefit from TDAP but this was not done today-to consider at next appointment.  GBS/GC/Chlamydia drawn today.

## 2012-04-14 NOTE — Patient Instructions (Addendum)
Thinks look great today! Your contractions are getting your body ready to have the child. Remember to come to the hospital if you have vaginal bleeding (may have spotting after cervical check), regular painful contractions (at least 5 minutes apart), abnormal discharge, leakage of fluid, or if baby is moving less. Come back in 1 week to see Dr. Mikel Cella or myself.   Circumcision options:  1. [facility] [phone]. $320 within 4 weeks of birth 2. [facility]. [phone]. $250 within 7 days of birth 3. [facility].  [phone]. $175 within 2 weeks of birth

## 2012-04-17 LAB — STREP B DNA PROBE: GBSP: POSITIVE

## 2012-04-25 ENCOUNTER — Ambulatory Visit (INDEPENDENT_AMBULATORY_CARE_PROVIDER_SITE_OTHER): Payer: Medicaid Other | Admitting: Family Medicine

## 2012-04-25 VITALS — BP 126/73 | Temp 97.8°F | Wt 306.0 lb

## 2012-04-25 DIAGNOSIS — Z23 Encounter for immunization: Secondary | ICD-10-CM

## 2012-04-25 NOTE — Patient Instructions (Signed)
Everything looks great today. Remember reasons we discussed for going to hospital.  You signed your tubal papers again. You got your TDAP which includes pertussis to help the baby not get this.

## 2012-04-25 NOTE — Progress Notes (Signed)
Kendra [name] 21 y.o. female  [id number] at [redacted]w[redacted]d presenting for follow up.  Patient deniesdecreased fetal movement/discharge/blood from vagina/rush of fluid. Does have some irregular contractions-had 4 in an hour over Christmas which then improved.  Counseled patient on reasons to return to our clinic or hospital.  IUD did not work before for unknown reason. Considering nexplanon or repeat tubal. Will defer further discussions to PCP and OB. Patient signed BTL papers today.  TDAP given today.  Patient did have trace edema today but reports high salt diet over holidays and BP similar to previous. No RUQ pain, headache, SOB.

## 2012-04-28 ENCOUNTER — Encounter (HOSPITAL_COMMUNITY): Payer: Self-pay | Admitting: *Deleted

## 2012-04-28 ENCOUNTER — Inpatient Hospital Stay (HOSPITAL_COMMUNITY)
Admission: AD | Admit: 2012-04-28 | Discharge: 2012-04-28 | Disposition: A | Discharge status: Hospice | Payer: Medicaid Other | Source: Ambulatory Visit | Attending: Obstetrics and Gynecology | Admitting: Obstetrics and Gynecology

## 2012-04-28 DIAGNOSIS — R109 Unspecified abdominal pain: Secondary | ICD-10-CM

## 2012-04-28 DIAGNOSIS — R1011 Right upper quadrant pain: Secondary | ICD-10-CM | POA: Insufficient documentation

## 2012-04-28 DIAGNOSIS — O479 False labor, unspecified: Secondary | ICD-10-CM | POA: Insufficient documentation

## 2012-04-28 DIAGNOSIS — R1012 Left upper quadrant pain: Secondary | ICD-10-CM | POA: Insufficient documentation

## 2012-04-28 NOTE — MAU Provider Note (Signed)
History     CSN: [id number]  Arrival date and time: 04/28/12 4098   First Provider Initiated Contact with Patient 04/28/12 1923      Chief Complaint  Patient presents with  . Abdominal Pain   Kendra [name] is a 20 year old G4P2012 at 38.[redacted] weeks gestation by early ultrasound presenting for upper abdominal pain and contraction-like pain all day.  Abdominal Pain This is a new problem. The current episode started in the past 7 days. The onset quality is gradual. The problem occurs intermittently. The problem has been unchanged. The pain is located in the LUQ and RUQ. The pain is at a severity of 0/10. The patient is experiencing no pain. The quality of the pain is aching. The pain is aggravated by movement. The pain is relieved by being still (counter pressure). She has tried nothing for the symptoms.    OB History    Grav Para Term Preterm Abortions TAB SAB Ect Mult Living   4 2 2  0 1 0 1 0 0 2      Past Medical History  Diagnosis Date  . Hypertension   . Headache     Past Surgical History  Procedure Date  . Cholecystectomy   . Tubal ligation   . Wisdom tooth extraction     Family History  Problem Relation Age of Onset  . Asthma Maternal Grandmother   . COPD Maternal Grandmother     History  Substance Use Topics  . Smoking status: Never Smoker   . Smokeless tobacco: Not on file  . Alcohol Use: No    Allergies: No Known Allergies  Prescriptions prior to admission  Medication Sig Dispense Refill  . Elastic Bandages & Supports (COMFORT FIT MATERNITY SUPP LG) MISC 1 Units by Does not apply route daily.  1 each  0  . Pediatric Multivit-Minerals-C (FLINTSTONES GUMMIES PLUS) CHEW Chew 2 capsules by mouth daily.       EFM: FHR: 130 Category I          Toco: 1 UC with UI          SVE: deferred  Review of Systems  Constitutional: Negative.   HENT: Negative.   Eyes: Negative.   Respiratory: Negative.   Cardiovascular: Negative.   Gastrointestinal: Positive for  abdominal pain.  Genitourinary: Negative.   Musculoskeletal: Negative.   Skin: Negative.   Neurological: Negative.   Endo/Heme/Allergies: Negative.   Psychiatric/Behavioral: Negative.    Physical Exam   Blood pressure 137/79, pulse 91, temperature 98.1 F (36.7 C), temperature source Oral, resp. rate 18, height 5' 7.5" (1.715 m), weight 138.619 kg (305 lb 9.6 oz), last menstrual period 07/26/2011, SpO2 100.00%, unknown if currently breastfeeding.  Physical Exam  Constitutional: She is oriented to person, place, and time. She appears well-developed and well-nourished.  HENT:  Head: Normocephalic.  Right Ear: External ear normal.  Left Ear: External ear normal.  Nose: Nose normal.  Eyes: Conjunctivae normal and EOM are normal.  Neck: Normal range of motion. Neck supple.  Cardiovascular: Normal rate, regular rhythm, normal heart sounds and intact distal pulses.   Respiratory: Effort normal and breath sounds normal.  GI: Soft. Bowel sounds are normal.  Genitourinary:       Gravid uterus  Musculoskeletal: Normal range of motion.  Neurological: She is alert and oriented to person, place, and time. She has normal reflexes.  Skin: Skin is warm and dry.  Psychiatric: She has a normal mood and affect. Her behavior is normal. Judgment  and thought content normal.    MAU Course  Procedures EFM   Assessment and Plan  21 year old [id number], IUP @ 38.[redacted] weeks gestation Discomforts of pregnancy False Labor  Discharge home with instructions for abdominal pain and false labor Keep scheduled appointment with [facility] on 05/02/12 Return to Maternity Admissions for any further complaints  Raelyn Mora, SNM 04/28/2012, 7:41 PM  Supervised by: Thressa Sheller, CNM I have seen the patient with the resident/student and agree with the above.

## 2012-04-28 NOTE — MAU Note (Signed)
Patient states she has been having upper abdominal and lower abdominal pain off and on for a couple of hours. Having lower abdominal pressure. Denies any bleeding or leaking and reports good fetal movement. States she is having a white vaginal discharge.

## 2012-04-30 NOTE — L&D Delivery Note (Signed)
Delivery Note At 12:51 AM a viable female was delivered via Vaginal, Spontaneous Delivery (Presentation: ;Left Occiput Anterior).  APGAR: 8, 9; weight pending Placenta status: Intact, Spontaneous.  Cord: 3 vessels with the following complications: None.   Anesthesia: None  Episiotomy: None Lacerations: None Suture Repair: n/a Est. Blood Loss (mL): 25  Mom to postpartum.  Baby to nursery-stable. Delivery by Bonnita Nasuti, MD supervised by myself CRESENZO-DISHMAN,Muneer Leider 05/08/2012, 1:11 AM

## 2012-05-01 NOTE — MAU Provider Note (Signed)
Attestation of Attending Supervision of Advanced Practitioner (CNM/NP): Evaluation and management procedures were performed by the Advanced Practitioner under my supervision and collaboration.  I have reviewed the Advanced Practitioner's note and chart, and I agree with the management and plan.  Jahseh Lucchese 05/01/2012 9:05 AM

## 2012-05-02 ENCOUNTER — Ambulatory Visit (INDEPENDENT_AMBULATORY_CARE_PROVIDER_SITE_OTHER): Payer: Medicaid Other | Admitting: Family Medicine

## 2012-05-02 ENCOUNTER — Encounter: Payer: Self-pay | Admitting: Family Medicine

## 2012-05-02 VITALS — BP 118/80 | Temp 98.3°F | Wt 308.0 lb

## 2012-05-02 DIAGNOSIS — Z3480 Encounter for supervision of other normal pregnancy, unspecified trimester: Secondary | ICD-10-CM

## 2012-05-02 NOTE — Progress Notes (Signed)
22 yo G4P2012 at [redacted]w[redacted]d routine prenatal appointment. Feeling ok; unable to sleep due to discomfort in her back. Irregular contractions, no vaginal bleeding or discharge. Good fetal movement. Some pelvic pressure  O: See Flowsheet Cervix 2.5cm external os, but internal os is fingertip  A/P:  - Labor precautions reviewed - Will RTC in one week. If not delivered by then, will schedule induction on 1/15 or 1/16

## 2012-05-02 NOTE — Patient Instructions (Addendum)
It was good to see you today! I am excited to meet [facility]! Please go to [facility] if you have any signs of labor. Return to see me in one week, if you have not gone into labor. We will schedule an induction one week past your due date.  See you soon! Kaylinn Dedic M. Xitlaly Ault, M.D.

## 2012-05-06 ENCOUNTER — Inpatient Hospital Stay (EMERGENCY_DEPARTMENT_HOSPITAL)
Admission: AD | Admit: 2012-05-06 | Discharge: 2012-05-07 | Disposition: A | Discharge status: Home / Self Care | Payer: Medicaid Other | Source: Ambulatory Visit | Attending: Obstetrics & Gynecology | Admitting: Obstetrics & Gynecology

## 2012-05-06 DIAGNOSIS — R109 Unspecified abdominal pain: Secondary | ICD-10-CM | POA: Insufficient documentation

## 2012-05-06 DIAGNOSIS — O479 False labor, unspecified: Secondary | ICD-10-CM | POA: Insufficient documentation

## 2012-05-07 ENCOUNTER — Encounter (HOSPITAL_COMMUNITY): Payer: Self-pay | Admitting: *Deleted

## 2012-05-07 ENCOUNTER — Inpatient Hospital Stay (HOSPITAL_COMMUNITY)
Admission: AD | Admit: 2012-05-07 | Discharge: 2012-05-10 | DRG: 775 | Disposition: A | Discharge status: Home / Self Care | Payer: Medicaid Other | Source: Ambulatory Visit | Attending: Obstetrics and Gynecology | Admitting: Obstetrics and Gynecology

## 2012-05-07 ENCOUNTER — Inpatient Hospital Stay (HOSPITAL_COMMUNITY)
Admission: AD | Admit: 2012-05-07 | Discharge: 2012-05-07 | Disposition: A | Discharge status: Home / Self Care | Payer: Medicaid Other | Source: Ambulatory Visit | Attending: Obstetrics and Gynecology | Admitting: Obstetrics and Gynecology

## 2012-05-07 DIAGNOSIS — O99892 Other specified diseases and conditions complicating childbirth: Principal | ICD-10-CM | POA: Diagnosis present

## 2012-05-07 DIAGNOSIS — O479 False labor, unspecified: Secondary | ICD-10-CM | POA: Insufficient documentation

## 2012-05-07 DIAGNOSIS — Z22330 Carrier of Group B streptococcus: Secondary | ICD-10-CM

## 2012-05-07 LAB — URINALYSIS, ROUTINE W REFLEX MICROSCOPIC
Bilirubin Urine: NEGATIVE
Glucose, UA: NEGATIVE mg/dL
Hgb urine dipstick: NEGATIVE
Nitrite: NEGATIVE
Specific Gravity, Urine: 1.02 (ref 1.005–1.030)
pH: 6 (ref 5.0–8.0)

## 2012-05-07 NOTE — MAU Note (Signed)
C/o ucs since this AM @ 0200;

## 2012-05-07 NOTE — MAU Provider Note (Signed)
Kendra [name] is a 22 y.o. female [phone] at [redacted]w[redacted]d presenting for eval of abd cramping. Denies leak or bldg. Reports +FM. She receives her prenatal care at [facility]. History OB History    Grav Para Term Preterm Abortions TAB SAB Ect Mult Living   4 2 2  0 1 0 1 0 0 2     Past Medical History  Diagnosis Date  . Hypertension   . Headache    Past Surgical History  Procedure Date  . Cholecystectomy   . Tubal ligation   . Wisdom tooth extraction    Family History: family history includes Asthma in her maternal grandmother and COPD in her maternal grandmother. Social History:  reports that she has never smoked. She does not have any smokeless tobacco history on file. She reports that she does not drink alcohol or use illicit drugs.    ROS    Last menstrual period 07/26/2011, unknown if currently breastfeeding. Maternal Exam:  Uterine Assessment: irreg mild ctx; no pattern  Cervix: Cx post, FT/thick/-3  Fetal Exam Fetal Monitor Review: Baseline rate: 135.  Variability: moderate (6-25 bpm).   Pattern: accelerations present and no decelerations.    Fetal State Assessment: Category I - tracings are normal.     Physical Exam  Constitutional: She is oriented to person, place, and time.       obese  HENT:  Head: Normocephalic.  Neck: Normal range of motion.  Cardiovascular: Normal rate.   Respiratory: Effort normal.  GI:       Gravid, soft  Genitourinary: Vagina normal.  Musculoskeletal: Normal range of motion.  Neurological: She is alert and oriented to person, place, and time.  Skin: Skin is warm and dry.  Psychiatric: She has a normal mood and affect. Her behavior is normal. Thought content normal.    Prenatal labs: ABO, Rh: O/POS/-- (07/10 0929) Antibody: NEG (07/10 0929) Rubella: 38.9 (07/10 0929) RPR: NON REAC (10/24 1615)  HBsAg: NEGATIVE (07/10 0929)  HIV: NON REACTIVE (10/24 1615)  GBS: POSITIVE (12/16 1519)   Assessment/Plan: IUP at  39.6wks Braxton Hicks ctx  D/C home with labor precautions F/U as sched at [facility] or sooner prn   SHAW, KIMBERLY 05/07/2012, 1:33 AM

## 2012-05-08 ENCOUNTER — Encounter (HOSPITAL_COMMUNITY): Payer: Self-pay | Admitting: *Deleted

## 2012-05-08 DIAGNOSIS — O9989 Other specified diseases and conditions complicating pregnancy, childbirth and the puerperium: Secondary | ICD-10-CM

## 2012-05-08 DIAGNOSIS — Z22330 Carrier of Group B streptococcus: Secondary | ICD-10-CM

## 2012-05-08 LAB — TYPE AND SCREEN: ABO/RH(D): O POS

## 2012-05-08 LAB — COMPREHENSIVE METABOLIC PANEL
ALT: 19 U/L (ref 0–35)
BUN: 9 mg/dL (ref 6–23)
CO2: 19 mEq/L (ref 19–32)
Calcium: 9.7 mg/dL (ref 8.4–10.5)
Creatinine, Ser: 0.65 mg/dL (ref 0.50–1.10)
GFR calc Af Amer: >90 mL/min (ref >90)
GFR calc non Af Amer: >90 mL/min (ref >90)
Glucose, Bld: 90 mg/dL (ref 70–99)
Sodium: 135 mEq/L (ref 135–145)
Total Protein: 7.3 g/dL (ref 6.0–8.3)

## 2012-05-08 LAB — CBC
HCT: 38.5 % (ref 36.0–46.0)
Hemoglobin: 13.1 g/dL (ref 12.0–15.0)
MCH: 30.8 pg (ref 26.0–34.0)
MCHC: 34 g/dL (ref 30.0–36.0)
MCV: 90.6 fL (ref 78.0–100.0)
RDW: 13.6 % (ref 11.5–15.5)

## 2012-05-08 LAB — PROTEIN / CREATININE RATIO, URINE
Creatinine, Urine: 240.59 mg/dL
Protein Creatinine Ratio: 0.21 — ABNORMAL HIGH (ref 0.00–0.15)
Total Protein, Urine: 50.7 mg/dL

## 2012-05-08 MED ORDER — FENTANYL 2.5 MCG/ML BUPIVACAINE 1/10 % EPIDURAL INFUSION (WH - ANES)
14.0000 mL/h | INTRAMUSCULAR | Status: DC
  Filled 2012-05-08: qty 125

## 2012-05-08 MED ORDER — WITCH HAZEL-GLYCERIN EX PADS: 1.0000 "application " | MEDICATED_PAD | CUTANEOUS | Status: DC | PRN

## 2012-05-08 MED ORDER — LIDOCAINE HCL (PF) 1 % IJ SOLN
30.0000 mL | INTRAMUSCULAR | Status: DC | PRN
  Filled 2012-05-08: qty 30

## 2012-05-08 MED ORDER — SIMETHICONE 80 MG PO CHEW: 80.0000 mg | CHEWABLE_TABLET | ORAL | Status: DC | PRN

## 2012-05-08 MED ORDER — OXYCODONE-ACETAMINOPHEN 5-325 MG PO TABS: 1.0000 | ORAL_TABLET | ORAL | Status: DC | PRN

## 2012-05-08 MED ORDER — ONDANSETRON HCL 4 MG/2ML IJ SOLN: 4.0000 mg | INTRAMUSCULAR | Status: DC | PRN

## 2012-05-08 MED ORDER — LACTATED RINGERS IV SOLN: 500.0000 mL | Freq: Once | INTRAVENOUS | Status: DC

## 2012-05-08 MED ORDER — BENZOCAINE-MENTHOL 20-0.5 % EX AERO: 1.0000 "application " | INHALATION_SPRAY | CUTANEOUS | Status: DC | PRN

## 2012-05-08 MED ORDER — LACTATED RINGERS IV SOLN: 500.0000 mL | INTRAVENOUS | Status: DC | PRN

## 2012-05-08 MED ORDER — IBUPROFEN 600 MG PO TABS
600.0000 mg | ORAL_TABLET | Freq: Four times a day (QID) | ORAL | Status: DC | PRN
  Administered 2012-05-08: 600 mg via ORAL
  Filled 2012-05-08: qty 1

## 2012-05-08 MED ORDER — DIBUCAINE 1 % RE OINT: 1.0000 "application " | TOPICAL_OINTMENT | RECTAL | Status: DC | PRN

## 2012-05-08 MED ORDER — OXYTOCIN 40 UNITS IN LACTATED RINGERS INFUSION - SIMPLE MED
62.5000 mL/h | INTRAVENOUS | Status: DC
  Administered 2012-05-08: 62.5 mL/h via INTRAVENOUS
  Filled 2012-05-08: qty 1000

## 2012-05-08 MED ORDER — CITRIC ACID-SODIUM CITRATE 334-500 MG/5ML PO SOLN: 30.0000 mL | ORAL | Status: DC | PRN

## 2012-05-08 MED ORDER — PRENATAL MULTIVITAMIN CH
1.0000 | ORAL_TABLET | Freq: Every day | ORAL | Status: DC
  Administered 2012-05-09 – 2012-05-10 (×2): 1 via ORAL
  Filled 2012-05-08 (×2): qty 1

## 2012-05-08 MED ORDER — PHENYLEPHRINE 40 MCG/ML (10ML) SYRINGE FOR IV PUSH (FOR BLOOD PRESSURE SUPPORT): 80.0000 ug | PREFILLED_SYRINGE | INTRAVENOUS | Status: DC | PRN

## 2012-05-08 MED ORDER — LACTATED RINGERS IV SOLN
INTRAVENOUS | Status: DC
  Administered 2012-05-08: 01:00:00 via INTRAVENOUS

## 2012-05-08 MED ORDER — IBUPROFEN 600 MG PO TABS
600.0000 mg | ORAL_TABLET | Freq: Four times a day (QID) | ORAL | Status: DC
  Administered 2012-05-08 – 2012-05-10 (×10): 600 mg via ORAL
  Filled 2012-05-08 (×10): qty 1

## 2012-05-08 MED ORDER — DIPHENHYDRAMINE HCL 50 MG/ML IJ SOLN: 12.5000 mg | INTRAMUSCULAR | Status: DC | PRN

## 2012-05-08 MED ORDER — ZOLPIDEM TARTRATE 5 MG PO TABS: 5.0000 mg | ORAL_TABLET | Freq: Every evening | ORAL | Status: DC | PRN

## 2012-05-08 MED ORDER — SENNOSIDES-DOCUSATE SODIUM 8.6-50 MG PO TABS
2.0000 | ORAL_TABLET | Freq: Every day | ORAL | Status: DC
  Administered 2012-05-09: 2 via ORAL

## 2012-05-08 MED ORDER — FLEET ENEMA 7-19 GM/118ML RE ENEM: 1.0000 | ENEMA | RECTAL | Status: DC | PRN

## 2012-05-08 MED ORDER — LANOLIN HYDROUS EX OINT: TOPICAL_OINTMENT | CUTANEOUS | Status: DC | PRN

## 2012-05-08 MED ORDER — ACETAMINOPHEN 325 MG PO TABS: 650.0000 mg | ORAL_TABLET | ORAL | Status: DC | PRN

## 2012-05-08 MED ORDER — PHENYLEPHRINE 40 MCG/ML (10ML) SYRINGE FOR IV PUSH (FOR BLOOD PRESSURE SUPPORT)
80.0000 ug | PREFILLED_SYRINGE | INTRAVENOUS | Status: DC | PRN
  Filled 2012-05-08: qty 5

## 2012-05-08 MED ORDER — ONDANSETRON HCL 4 MG/2ML IJ SOLN: 4.0000 mg | Freq: Four times a day (QID) | INTRAMUSCULAR | Status: DC | PRN

## 2012-05-08 MED ORDER — OXYCODONE-ACETAMINOPHEN 5-325 MG PO TABS
1.0000 | ORAL_TABLET | ORAL | Status: DC | PRN
  Filled 2012-05-08: qty 1

## 2012-05-08 MED ORDER — EPHEDRINE 5 MG/ML INJ
10.0000 mg | INTRAVENOUS | Status: DC | PRN
  Filled 2012-05-08: qty 4

## 2012-05-08 MED ORDER — SODIUM CHLORIDE 0.9 % IV SOLN
2.0000 g | Freq: Once | INTRAVENOUS | Status: AC
  Administered 2012-05-08: 2 g via INTRAVENOUS
  Filled 2012-05-08: qty 2000

## 2012-05-08 MED ORDER — TETANUS-DIPHTH-ACELL PERTUSSIS 5-2.5-18.5 LF-MCG/0.5 IM SUSP
0.5000 mL | Freq: Once | INTRAMUSCULAR | Status: DC
  Filled 2012-05-08: qty 0.5

## 2012-05-08 MED ORDER — DIPHENHYDRAMINE HCL 25 MG PO CAPS: 25.0000 mg | ORAL_CAPSULE | Freq: Four times a day (QID) | ORAL | Status: DC | PRN

## 2012-05-08 MED ORDER — OXYTOCIN BOLUS FROM INFUSION: 500.0000 mL | INTRAVENOUS | Status: DC

## 2012-05-08 MED ORDER — INFLUENZA VIRUS VACC SPLIT PF IM SUSP
0.5000 mL | INTRAMUSCULAR | Status: AC
  Administered 2012-05-09: 0.5 mL via INTRAMUSCULAR
  Filled 2012-05-08: qty 0.5

## 2012-05-08 MED ORDER — EPHEDRINE 5 MG/ML INJ: 10.0000 mg | INTRAVENOUS | Status: DC | PRN

## 2012-05-08 MED ORDER — ONDANSETRON HCL 4 MG PO TABS: 4.0000 mg | ORAL_TABLET | ORAL | Status: DC | PRN

## 2012-05-08 NOTE — Progress Notes (Signed)
UR chart review completed.  

## 2012-05-08 NOTE — H&P (Signed)
Kendra [name] is a 22 y.o. female presenting for labor. History OB History    Grav Para Term Preterm Abortions TAB SAB Ect Mult Living   4 2 2  0 1 0 1 0 0 2     Past Medical History  Diagnosis Date  . Hypertension   . Headache    Past Surgical History  Procedure Date  . Cholecystectomy   . Tubal ligation   . Wisdom tooth extraction    Family History: family history includes Asthma in her maternal grandmother and COPD in her maternal grandmother. Social History:  reports that she has never smoked. She does not have any smokeless tobacco history on file. She reports that she does not drink alcohol or use illicit drugs.   Prenatal Transfer Tool  Maternal Diabetes: No Genetic Screening: Declined Maternal Ultrasounds/Referrals: Normal Fetal Ultrasounds or other Referrals:  Other: normal anatomy scan Maternal Substance Abuse:  No Significant Maternal Medications:  None Significant Maternal Lab Results:  Lab values include: Group B Strep positive Other Comments:  None  ROS  + HA, no vision changes, RUQ pain  Dilation: 4 Effacement (%): 80 Station: -1 Exam by:: weston,rn Blood pressure 163/104, pulse 107, last menstrual period 07/26/2011, unknown if currently breastfeeding. Exam Physical Exam  Prenatal labs: ABO, Rh: O/POS/-- (07/10 0929) Antibody: NEG (07/10 0929) Rubella: 38.9 (07/10 0929) RPR: NON REAC (10/24 1615)  HBsAg: NEGATIVE (07/10 0929)  HIV: NON REACTIVE (10/24 1615)  GBS: POSITIVE (12/16 1519)   Assessment/Plan:G4P2012 @ [redacted]w[redacted]d in active labor, elevated B/P's with HA.  Discussed with Dr. Emelda Fear:  Will run PreX labs, see what B/P does after pain meds.       Kendra [name],Kendra [name] 05/08/2012, 12:18 AM

## 2012-05-08 NOTE — Progress Notes (Signed)
Post Partum Day 0 Subjective: no complaints, up ad lib, voiding and tolerating PO. Overall, doing well. Breast feeding but difficulty latching.  Objective: Blood pressure 124/68, pulse 92, temperature 98.5 F (36.9 C), temperature source Oral, resp. rate 18, height 5' 7.5" (1.715 m), weight 138.801 kg (306 lb), last menstrual period 07/26/2011, unknown if currently breastfeeding.  Physical Exam:  General: alert, cooperative and no distress Lochia: appropriate Uterine Fundus: firm Incision: n/a DVT Evaluation: No evidence of DVT seen on physical exam.   Basename 05/08/12 0005  HGB 13.1  HCT 38.5   Assessment/Plan: Breastfeeding, Lactation consult and Contraception Nexplanon. Planning outpatient circ.   LOS: 1 day   Kendra [name] 05/08/2012, 8:09 AM

## 2012-05-08 NOTE — Progress Notes (Signed)
This note also relates to the following rows which could not be included: BP - Cannot attach notes to unvalidated device data Pulse Rate - Cannot attach notes to unvalidated device data   Called provider to report increased b/p's, states to call if they get above 160/110

## 2012-05-09 ENCOUNTER — Encounter: Payer: Medicaid Other | Admitting: Family Medicine

## 2012-05-09 NOTE — Progress Notes (Signed)
Post Partum Day #1  Subjective: no complaints, up ad lib, voiding, tolerating PO and + flatus Pain is minimal. Up with no difficulties. +Bowel movement  Objective: Blood pressure 103/67, pulse 82, temperature 97.8 F (36.6 C), temperature source Oral, resp. rate 18, height 5' 7.5" (1.715 m), weight 138.801 kg (306 lb), last menstrual period 07/26/2011, SpO2 97.00%, unknown if currently breastfeeding.  Physical Exam:  General: alert, cooperative and no distress Lochia: appropriate Uterine Fundus: firm Incision: n/a DVT Evaluation: No evidence of DVT seen on physical exam.  Basename 05/08/12 0005  HGB 13.1  HCT 38.5    Assessment/Plan: Plan for discharge tomorrow, Breastfeeding and Lactation consult. Outpatient circ. Nexplanon for contraception. Plan for follow up at [facility] in 4-6 weeks.   LOS: 2 days   HAIRFORD, AMBER 05/09/2012, 8:01 AM   I have seen this patient and agree with the above resident's note.  LEFTWICH-KIRBY, Catrell Morrone Certified Nurse-Midwife

## 2012-05-10 MED ORDER — IBUPROFEN 600 MG PO TABS: 600.0000 mg | ORAL_TABLET | Freq: Four times a day (QID) | ORAL | Status: DC | PRN

## 2012-05-10 NOTE — Discharge Summary (Signed)
Attestation of Attending Supervision of Advanced Practitioner (PA/CNM/NP): Evaluation and management procedures were performed by the Advanced Practitioner under my supervision and collaboration.  I have reviewed the Advanced Practitioner's note and chart, and I agree with the management and plan.  Kolbey Teichert, MD, FACOG Attending Obstetrician & Gynecologist Faculty Practice, [facility]  

## 2012-05-10 NOTE — Discharge Summary (Signed)
Obstetric Discharge Summary Reason for Admission: onset of labor Prenatal Procedures: none Intrapartum Procedures: spontaneous vaginal delivery, GBS prophylaxis Postpartum Procedures: none Complications-Operative and Postpartum: none Eating, drinking, voiding, ambulating well.  +flatus.  Lochia and pain wnl.  No complaints.   Hemoglobin  Date Value Range Status  05/08/2012 13.1  12.0 - 15.0 g/dL Final     HCT  Date Value Range Status  05/08/2012 38.5  36.0 - 46.0 % Final    Physical Exam:  General: alert, cooperative and no distress Lochia: appropriate Uterine Fundus: firm Incision: n/a DVT Evaluation: No evidence of DVT seen on physical exam. Negative Homan's sign. No cords or calf tenderness. No significant calf/ankle edema.  Discharge Diagnoses: Term Pregnancy-delivered  Discharge Information: Date: 05/10/2012 Activity: pelvic rest Diet: routine Medications: PNV and Ibuprofen Condition: stable Instructions: refer to practice specific booklet Discharge to: home Follow-up Information    Schedule an appointment as soon as possible for a visit with [facility]. (in 4-6wks for you postpartum visit.)    Contact information:   [address] [place] [address] [phone]         Newborn Data: Live born female  Birth Weight: 5 lb 11 oz (2580 g) APGAR: 8, 9  Home with mother. Pumping and bottlefeeding, nexplanon for contraception, outpatient circumcision   Marge Duncans 05/10/2012, 1:47 PM

## 2012-05-12 NOTE — H&P (Signed)
Attestation of Attending Supervision of Advanced Practitioner: Evaluation and management procedures were performed by the PA/NP/CNM/OB Fellow under my supervision/collaboration. Chart reviewed and agree with management and plan.  Malin Cervini V 05/12/2012 9:26 AM

## 2012-06-04 ENCOUNTER — Telehealth: Payer: Self-pay | Admitting: Family Medicine

## 2012-06-04 NOTE — Telephone Encounter (Signed)
Patient is coming in on 2/28 and wants to be sure that the [facility] will be in for that appt.  She was told by Dr. Mikel Cella to let the nurse know when she would be coming for her 6 week appt.

## 2012-06-04 NOTE — Telephone Encounter (Signed)
Noted and pts name put on one implanon to reserve. Kendra [name], Maryjo [place]

## 2012-06-04 NOTE — Telephone Encounter (Signed)
I have not been Nexplanon certified. She will need to schedule an appointment with another provider to have this placed in case I cannot get the course by then. I apologize for any inconvenience!  Hospital doctor

## 2012-06-05 ENCOUNTER — Encounter: Payer: Self-pay | Admitting: *Deleted

## 2012-06-05 NOTE — Telephone Encounter (Signed)
"  the subscriber you have dialed is not in service"  No way to contact pt, will have to wait for callback or till her appt on the 28th.  If she does call back we can schedule her in colpo anytime for the nexplanon insert (ok with Dr. Jennette Kettle) and she can keep the appt for pp with Dr. Mikel Cella.  Will also mail a letter to pt.  Kymber Kosar, Maryjo [place]

## 2012-06-05 NOTE — Telephone Encounter (Signed)
Amber,  Do we want to get someone else to see her for the postpartum check or just have her come to colpo clinic for nexplanon.  And can she get it before her PP visit? Fleeger, Maryjo [place]

## 2012-06-05 NOTE — Telephone Encounter (Signed)
Either way will be fine. I can see her for her pp visit and she can come back for nexplanon or if she wants to do it all at same visit with a different provider that will be fine. She can get it anytime!  Thank you!

## 2012-06-27 ENCOUNTER — Encounter: Payer: Self-pay | Admitting: Family Medicine

## 2012-06-27 ENCOUNTER — Ambulatory Visit (INDEPENDENT_AMBULATORY_CARE_PROVIDER_SITE_OTHER): Payer: Medicaid Other | Admitting: Family Medicine

## 2012-06-27 MED ORDER — TRAMADOL HCL 50 MG PO TABS: 50.0000 mg | ORAL_TABLET | Freq: Three times a day (TID) | ORAL | Status: DC | PRN

## 2012-06-27 MED ORDER — MELOXICAM 15 MG PO TABS: 15.0000 mg | ORAL_TABLET | Freq: Every day | ORAL | Status: DC

## 2012-06-27 NOTE — Patient Instructions (Addendum)
It was good to see you today. I am glad everything is going well.  For your hip pain, I have sent in prescriptions for inflammation and Ultram you can take as needed for pain. We will make an appointment for you to come back for your Nexplanon insertion. I am sorry I was not able to do that today.  Krystle Polcyn M. Lamone Ferrelli, M.D.

## 2012-06-27 NOTE — Progress Notes (Signed)
  Subjective:     Kendra [name] is a 22 y.o. female who presents for a postpartum visit. She is 6 weeks postpartum following a spontaneous vaginal delivery. I have fully reviewed the prenatal and intrapartum course. The delivery was at [redacted] weeks gestational weeks. Outcome: spontaneous vaginal delivery. Anesthesia: none. Postpartum course has been unremarkable. Baby's course has been excellent. Baby is feeding by bottle - Kendra [name]. Bleeding no bleeding. Bowel function is normal. Bladder function is normal. Patient is not sexually active. Contraception method is none and planning Nexplanon. Postpartum depression screening: negative.  Left hip and knee pain. Old hip injury 9 years ago that was doing well until delivery. Pain is worse with lying on right side or rest. If moving pain is tolerable. Tried IBU with some relief.   The following portions of the patient's history were reviewed and updated as appropriate: allergies, current medications, past family history, past medical history, past social history, past surgical history and problem list.  Review of Systems Pertinent items are noted in HPI.   Objective:    BP 138/84  Pulse 76  Temp(Src) 98.1 F (36.7 C) (Oral)  Ht 5' 7.5" (1.715 m)  Wt 310 lb (140.615 kg)  BMI 47.81 kg/m2  LMP 06/16/2012  Breastfeeding? No  General:  alert and cooperative   Breasts:  inspection negative, no nipple discharge or bleeding, no masses or nodularity palpable  Lungs: clear to auscultation bilaterally  Heart:  regular rate and rhythm, S1, S2 normal, no murmur, click, rub or gallop  Abdomen: soft, non-tender; bowel sounds normal; no masses,  no organomegaly        Assessment:     Normal postpartum exam. Pap smear not done at today's visit.   Plan:    1. Contraception: Nexplanon which will be scheduled in procedure clinic on July 03, 2012 2. Hip pain: Likely secondary to child birth or arthritis. Patient encouraged to lose weight and use  ice/heat for symptomatic treatment. Given Rx for Mobic daily, as well as Ultram as needed for severe pain especially at night. 3. Follow up  as needed.

## 2012-07-03 ENCOUNTER — Encounter: Payer: Self-pay | Admitting: Family Medicine

## 2012-07-03 ENCOUNTER — Ambulatory Visit (INDEPENDENT_AMBULATORY_CARE_PROVIDER_SITE_OTHER): Payer: Medicaid Other | Admitting: Family Medicine

## 2012-07-03 DIAGNOSIS — Z3046 Encounter for surveillance of implantable subdermal contraceptive: Secondary | ICD-10-CM

## 2012-07-03 DIAGNOSIS — Z309 Encounter for contraceptive management, unspecified: Secondary | ICD-10-CM

## 2012-07-03 DIAGNOSIS — Z30017 Encounter for initial prescription of implantable subdermal contraceptive: Secondary | ICD-10-CM

## 2012-07-03 DIAGNOSIS — Z9851 Tubal ligation status: Secondary | ICD-10-CM

## 2012-07-03 HISTORY — PX: OTHER SURGICAL HISTORY: SHX169

## 2012-07-03 MED ORDER — ETONOGESTREL 68 MG ~~LOC~~ IMPL
68.0000 mg | DRUG_IMPLANT | Freq: Once | SUBCUTANEOUS | Status: AC
  Administered 2012-07-03: 68 mg via SUBCUTANEOUS

## 2012-07-03 NOTE — Progress Notes (Signed)
Patient ID: Kendra [name], female   DOB: [birth year], 22 y.o.   MRN: [id number] BTL 07/2010 but became pregnant in 3013 with delivery 04/2012 Desires implanon today Neg pregnancy test  PROCEDURE NOTE: NEXPLANON  INSERTION Patient given informed consent and signed copy in the chart.  Pregnancy test was negative  Appropriate time out was taken. LEFT arm was prepped and draped in the usual sterile fashion. Appropriate measurement was made for insertion of nexplanon and landmarks identified, insertion site marked. Two cc of 1%lidocaine  was used for local anesthesia. Once anesthesia obtained, nexplanon was inserted in typical fashion. No complications. Pressure bandage applied to decrease bruising. Patient given follow up instructions should she experience redness, swelling at sight or fever in the next 24 hours. Patient given Nexplanon pocket card.

## 2012-08-20 ENCOUNTER — Ambulatory Visit: Payer: Medicaid Other | Admitting: Family Medicine

## 2012-09-19 ENCOUNTER — Encounter: Payer: Self-pay | Admitting: Family Medicine

## 2012-09-19 ENCOUNTER — Ambulatory Visit (INDEPENDENT_AMBULATORY_CARE_PROVIDER_SITE_OTHER): Payer: Self-pay | Admitting: Family Medicine

## 2012-09-19 VITALS — BP 121/74 | HR 78 | Temp 99.0°F | Ht 67.75 in | Wt 297.0 lb

## 2012-09-19 DIAGNOSIS — M25569 Pain in unspecified knee: Secondary | ICD-10-CM

## 2012-09-19 DIAGNOSIS — M25562 Pain in left knee: Secondary | ICD-10-CM

## 2012-09-19 DIAGNOSIS — F410 Panic disorder [episodic paroxysmal anxiety] without agoraphobia: Secondary | ICD-10-CM

## 2012-09-19 MED ORDER — TRAMADOL HCL 50 MG PO TABS: 50.0000 mg | ORAL_TABLET | Freq: Three times a day (TID) | ORAL | Status: DC | PRN

## 2012-09-19 NOTE — Assessment & Plan Note (Signed)
Unclear what the precipitating factor is. It has only happened twice in her entire life I am not inclined to start any medications. Begins to occur more frequently or consider obtaining urine to screen for pheochromocytoma and, if this was negative, would consider starting an SSRI. For now no intervention.

## 2012-09-19 NOTE — Assessment & Plan Note (Signed)
That this has been going on for several months I will obtain an x-ray. I of the patient the option of a steroid injection. She was managed at this time. I provided her with a refill of tramadol but am not interested in increasing the strength of her pain medication without a clear diagnosis.

## 2012-09-19 NOTE — Patient Instructions (Signed)
Please go to radiology and have an x-ray taken of your knee. If you start having more of the panic attacks, come back to see [place] and we can talk about a daily medication for them.

## 2012-09-19 NOTE — Progress Notes (Signed)
Patient ID: Kendra [name], female   DOB: [birth year], 22 y.o.   MRN: [id number] Subjective: The patient is a 22 y.o. year old female who presents today for panic attack.  1. Panic attack: This occurred yesterday. Patient was at work but reports that she is not been feeling very well prior to going to work. She cannot describe this isn't incredibly well. She reports that she had sudden onset of racing heartbeat, chest tightness, and shortness of breath. EMS was called but she denies going to the emergency room. Patient's symptoms spontaneously resolved within 45 minutes. Patient reports she's had this happen one other time many years ago when she was pregnant (22 y.o.).  2. Knee pain: Patient continues to complain of left knee pain. It is worse after long periods of standing. She does feel there is a small amount of swelling. There is no warmth, erythema, drainage, or open sores. There is no known injury. This is been occurring ever since the end of her pregnancy (22 y.o.).  Patient's past medical, social, and family history were reviewed and updated as appropriate. History  Substance Use Topics  . Smoking status: Never Smoker   . Smokeless tobacco: Not on file  . Alcohol Use: No   Objective:  Filed Vitals:   09/19/12 0918  BP: 121/74  Pulse: 78  Temp: 99 F (37.2 C)   Gen: Morbidly obese, no distress Ext: Left knee is normal in appearance. There is no pain on palpation along the joint lines. There is mild discomfort with compression of the patella. There is no joint laxity. There is no obvious effusion. There is no warmth or erythema.  Assessment/Plan:  Please also see individual problems in problem list for problem-specific plans.

## 2012-10-13 ENCOUNTER — Emergency Department (HOSPITAL_COMMUNITY)
Admission: EM | Admit: 2012-10-13 | Discharge: 2012-10-13 | Disposition: A | Discharge status: Home / Self Care | Payer: Self-pay | Attending: Emergency Medicine | Admitting: Emergency Medicine

## 2012-10-13 ENCOUNTER — Encounter (HOSPITAL_COMMUNITY): Payer: Self-pay | Admitting: Emergency Medicine

## 2012-10-13 DIAGNOSIS — Z975 Presence of (intrauterine) contraceptive device: Secondary | ICD-10-CM | POA: Insufficient documentation

## 2012-10-13 DIAGNOSIS — R51 Headache: Secondary | ICD-10-CM | POA: Insufficient documentation

## 2012-10-13 DIAGNOSIS — N938 Other specified abnormal uterine and vaginal bleeding: Secondary | ICD-10-CM | POA: Insufficient documentation

## 2012-10-13 DIAGNOSIS — I10 Essential (primary) hypertension: Secondary | ICD-10-CM | POA: Insufficient documentation

## 2012-10-13 DIAGNOSIS — Z3202 Encounter for pregnancy test, result negative: Secondary | ICD-10-CM | POA: Insufficient documentation

## 2012-10-13 DIAGNOSIS — M545 Low back pain, unspecified: Secondary | ICD-10-CM | POA: Insufficient documentation

## 2012-10-13 DIAGNOSIS — N949 Unspecified condition associated with female genital organs and menstrual cycle: Secondary | ICD-10-CM | POA: Insufficient documentation

## 2012-10-13 DIAGNOSIS — E876 Hypokalemia: Secondary | ICD-10-CM | POA: Insufficient documentation

## 2012-10-13 DIAGNOSIS — R109 Unspecified abdominal pain: Secondary | ICD-10-CM | POA: Insufficient documentation

## 2012-10-13 DIAGNOSIS — R5381 Other malaise: Secondary | ICD-10-CM | POA: Insufficient documentation

## 2012-10-13 DIAGNOSIS — R42 Dizziness and giddiness: Secondary | ICD-10-CM | POA: Insufficient documentation

## 2012-10-13 LAB — URINE MICROSCOPIC-ADD ON

## 2012-10-13 LAB — POCT I-STAT, CHEM 8
Chloride: 110 mEq/L (ref 96–112)
Creatinine, Ser: 0.8 mg/dL (ref 0.50–1.10)
Hemoglobin: 13.3 g/dL (ref 12.0–15.0)
Potassium: 3.4 mEq/L — ABNORMAL LOW (ref 3.5–5.1)
Sodium: 142 mEq/L (ref 135–145)

## 2012-10-13 LAB — CBC WITH DIFFERENTIAL/PLATELET
Eosinophils Absolute: 0.1 10*3/uL (ref 0.0–0.7)
Hemoglobin: 13.1 g/dL (ref 12.0–15.0)
Lymphocytes Relative: 50 % — ABNORMAL HIGH (ref 12–46)
Lymphs Abs: 2.7 10*3/uL (ref 0.7–4.0)
Monocytes Relative: 6 % (ref 3–12)
Neutro Abs: 2.3 10*3/uL (ref 1.7–7.7)
Neutrophils Relative %: 42 % — ABNORMAL LOW (ref 43–77)
Platelets: 289 10*3/uL (ref 150–400)
RBC: 4.47 MIL/uL (ref 3.87–5.11)
WBC: 5.4 10*3/uL (ref 4.0–10.5)

## 2012-10-13 LAB — URINALYSIS, ROUTINE W REFLEX MICROSCOPIC
Leukocytes, UA: NEGATIVE
Nitrite: NEGATIVE
Specific Gravity, Urine: 1.031 — ABNORMAL HIGH (ref 1.005–1.030)
Urobilinogen, UA: 1 mg/dL (ref 0.0–1.0)
pH: 6 (ref 5.0–8.0)

## 2012-10-13 MED ORDER — SODIUM CHLORIDE 0.9 % IV BOLUS (SEPSIS)
1000.0000 mL | Freq: Once | INTRAVENOUS | Status: AC
  Administered 2012-10-13: 1000 mL via INTRAVENOUS

## 2012-10-13 MED ORDER — POTASSIUM CHLORIDE CRYS ER 20 MEQ PO TBCR
40.0000 meq | EXTENDED_RELEASE_TABLET | Freq: Once | ORAL | Status: AC
  Administered 2012-10-13: 40 meq via ORAL
  Filled 2012-10-13: qty 2

## 2012-10-13 MED ORDER — ONDANSETRON HCL 4 MG/2ML IJ SOLN
4.0000 mg | Freq: Once | INTRAMUSCULAR | Status: AC
  Administered 2012-10-13: 4 mg via INTRAVENOUS
  Filled 2012-10-13: qty 2

## 2012-10-13 MED ORDER — IBUPROFEN 800 MG PO TABS
800.0000 mg | ORAL_TABLET | Freq: Once | ORAL | Status: AC
  Administered 2012-10-13: 800 mg via ORAL
  Filled 2012-10-13: qty 1

## 2012-10-13 NOTE — ED Notes (Signed)
Pt reports beginning implanon birth control approx 3 mo ago. Pt reports since the end of May she has had heavy bleeding making her feel tired, dizzy and weak. Pt c/o lower abdominal and upper thigh pain, cramping sensation.

## 2012-10-13 NOTE — ED Provider Notes (Signed)
Medical screening examination/treatment/procedure(s) were performed by non-physician practitioner and as supervising physician I was immediately available for consultation/collaboration.  Jaquelynn Wanamaker, MD 10/13/12 1947 

## 2012-10-13 NOTE — ED Provider Notes (Signed)
History     CSN: [id number]  Arrival date & time 10/13/12  1018   First MD Initiated Contact with Patient 10/13/12 1116      Chief Complaint  Patient presents with  . Vaginal Bleeding    (Consider location/radiation/quality/duration/timing/severity/associated sxs/prior treatment) HPI Comments: 22 y.o. Female with PMHx of HTN presents today complaining of abdominal pain and heavy vaginal bleeding that started acutely about 2 weeks ago. Bleeding started with spotting about two weeks ago, progressed to a heavier darker blood. Over the last few days has increased in flow. Bright red. Pt going through 2 pads an hour. Abdominal pain started gradually within the last week. Central. Lower. Described as crampy, sharp at times. Also endorses lower back pain that started about the same time. Admits dizziness, weakness, headache, regular bowel movement. Denies fever, nausea, vomiting.  Pt states tubal ligation in 2012.  Pt is not followed by an OB. She sees Dr. Leanna Sato and Dr. Lloyd Huger at [facility] who helped her with the Implanon. Placed late April or early May. LMP was 07/2012  Patient is a 22 y.o. female presenting with vaginal bleeding. The history is provided by the patient.  Vaginal Bleeding Quality:  Bright red Severity:  Moderate Onset quality:  Gradual Duration: 2 weeks some scattered dark spotting, this past week,heavier bright red blood  Timing:  Intermittent Progression:  Worsening Chronicity:  New Number of pads used:  One pad every 3 hours Possible pregnancy: no   Context: spontaneously   Relieved by:  Nothing Associated symptoms: abdominal pain and dizziness   Associated symptoms: no dysuria, no fever and no nausea     Past Medical History  Diagnosis Date  . Hypertension   . [id number](478.2)     Past Surgical History  Procedure Laterality Date  . Cholecystectomy    . Wisdom tooth extraction    . Tubal ligation  07/2010    got pregnant in 2013 despite tubal so  inserted nexplanon 06/2012  . Nexplanon Left 07/03/2012    [id number]    Family History  Problem Relation Age of Onset  . Asthma Maternal Grandmother   . COPD Maternal Grandmother     History  Substance Use Topics  . Smoking status: Never Smoker   . Smokeless tobacco: Not on file  . Alcohol Use: No    OB History   Grav Para Term Preterm Abortions TAB SAB Ect Mult Living   4 3 3  0 1 0 1 0 0 3      Review of Systems  Constitutional: Negative for fever and diaphoresis.  HENT: Negative for neck pain and neck stiffness.   Eyes: Negative for visual disturbance.  Respiratory: Negative for apnea, chest tightness and shortness of breath.   Cardiovascular: Negative for chest pain and palpitations.  Gastrointestinal: Positive for abdominal pain. Negative for nausea, vomiting, diarrhea and constipation.  Genitourinary: Positive for vaginal bleeding. Negative for dysuria.  Musculoskeletal: Negative for gait problem.  Skin: Negative for rash.  Neurological: Positive for dizziness and headaches. Negative for weakness, light-headedness and numbness.       Gradual onset, frontal    Allergies  Review of patient's allergies indicates no known allergies.  Home Medications   Current Outpatient Rx  Name  Route  Sig  Dispense  Refill  . ibuprofen (ADVIL,MOTRIN) 600 MG tablet   Oral   Take 1 tablet (600 mg total) by mouth every 6 (six) hours as needed for pain.   30 tablet   0   .  meloxicam (MOBIC) 15 MG tablet   Oral   Take 1 tablet (15 mg total) by mouth daily.   30 tablet   0   . Pediatric Multivit-Minerals-C (FLINTSTONES GUMMIES PLUS) CHEW   Oral   Chew 2 capsules by mouth daily.         . traMADol (ULTRAM) 50 MG tablet   Oral   Take 1 tablet (50 mg total) by mouth every 8 (eight) hours as needed for pain.   90 tablet   3     BP 134/78  Pulse 85  Temp(Src) 98.8 F (37.1 C) (Oral)  Resp 18  SpO2 97%  Physical Exam  Nursing note and vitals  reviewed. Constitutional: She is oriented to person, place, and time. She appears well-developed and well-nourished. No distress.  HENT:  Head: Normocephalic and atraumatic.  Eyes: Conjunctivae and EOM are normal.  Neck: Normal range of motion. Neck supple.  No meningeal signs  Cardiovascular: Normal rate, regular rhythm and normal heart sounds.  Exam reveals no gallop and no friction rub.   No murmur heard. Pulmonary/Chest: Effort normal and breath sounds normal. No respiratory distress. She has no wheezes. She has no rales. She exhibits no tenderness.  Abdominal: Soft. Bowel sounds are normal. She exhibits no distension. There is no tenderness. There is no rebound and no guarding.  No tenderness to palpation.   Musculoskeletal: Normal range of motion. She exhibits no edema and no tenderness.  Neurological: She is alert and oriented to person, place, and time. No cranial nerve deficit.  Skin: Skin is warm and dry. She is not diaphoretic. No erythema.  Psychiatric: She has a normal mood and affect.    ED Course  Procedures (including critical care time)  Labs Reviewed  URINALYSIS, ROUTINE W REFLEX MICROSCOPIC - Abnormal; Notable for the following:    Color, Urine AMBER (*)    APPearance HAZY (*)    Specific Gravity, Urine 1.031 (*)    Hgb urine dipstick LARGE (*)    Bilirubin Urine SMALL (*)    Ketones, ur 15 (*)    All other components within normal limits  CBC WITH DIFFERENTIAL - Abnormal; Notable for the following:    Neutrophils Relative % 42 (*)    Lymphocytes Relative 50 (*)    All other components within normal limits  POCT I-STAT, CHEM 8 - Abnormal; Notable for the following:    Potassium 3.4 (*)    BUN 5 (*)    All other components within normal limits  URINE MICROSCOPIC-ADD ON  PREGNANCY, URINE   No results found.   1. Heavy vaginal bleeding due to contraceptive implant use, initial encounter   2. Hypokalemia       MDM  Patient is afebrile, nontoxic,  nonseptic appearing, in no apparent distress.  On physical exam patient does not appear to have a surgical abdomen and there are no peritoneal signs.  No indication of appendicitis, bowel obstruction, bowel perforation, cholecystitis, diverticulitis, PID or ectopic pregnancy.   Labs, imaging and vitals reviewed. Mild hypokalemia noted and discussed with pt. Potassium given. Patient discharged home with symptomatic treatment and given strict instructions for follow-up with their primary care physician.  Resource list given and directed to follow up with [facility]. I have also discussed reasons to return immediately to the ER.  Patient expresses understanding and agrees with plan. Discussed case with Dr. Judd Lien who agrees with plan.    Glade Nurse, PA-C 10/13/12 1728

## 2012-10-13 NOTE — ED Notes (Signed)
Pt states that she has dizziness with standing and head movements.

## 2013-02-27 ENCOUNTER — Ambulatory Visit (INDEPENDENT_AMBULATORY_CARE_PROVIDER_SITE_OTHER): Payer: Medicaid Other | Admitting: Family Medicine

## 2013-02-27 ENCOUNTER — Encounter: Payer: Self-pay | Admitting: Family Medicine

## 2013-02-27 ENCOUNTER — Ambulatory Visit: Payer: Medicaid Other | Admitting: Family Medicine

## 2013-02-27 VITALS — BP 122/76 | HR 80 | Temp 98.0°F | Ht 68.0 in | Wt 303.0 lb

## 2013-02-27 DIAGNOSIS — K529 Noninfective gastroenteritis and colitis, unspecified: Secondary | ICD-10-CM

## 2013-02-27 DIAGNOSIS — K5289 Other specified noninfective gastroenteritis and colitis: Secondary | ICD-10-CM

## 2013-02-27 NOTE — Patient Instructions (Signed)
It was nice to see you. I think you have a viral infection causing the nausea, vomiting and diarrhea. You will start to feel better over the next couple of days when your appetite returns. Please go to urgent care this weekend if you cannot keep down any fluids.   Sincerely,   Dr. Clinton Sawyer

## 2013-02-27 NOTE — Progress Notes (Signed)
  Subjective:    Patient ID: Kendra [name], female    DOB: [birth year], 24 y.o.   MRN: [id number]  HPI  22 year old F with nausea and vomiting. Started Wednesday and lasted 24 hours. Accompanied by non bloody diarrhea which has also resolved. She still has abdominal pain and decreased appetite but is tolerating liquids. She denies having a bowel movement today. She also denies fever and chills.   Review of Systems Positive for low back pain    Objective:   Physical Exam  BP 122/76  Pulse 80  Temp(Src) 98 F (36.7 C) (Oral)  Ht 5\' 8"  (1.727 m)  Wt 303 lb (137.44 kg)  BMI 46.08 kg/m2  Breastfeeding? No  Gen: obese female, nondistressed, well-appearing HEENT: normocephalic atraumatic pupils equal and active to light, OP clear and moist Cardiovascular: regular rate and rhythm, 2+ peripheral pulses Abdomen: soft, nondistended, hypoactive bowel sounds     Assessment & Plan:  Patient with post viral gastroparesis which should resolve with expectant management. Patient will continue to hydrate and return to work.

## 2014-03-01 ENCOUNTER — Encounter: Payer: Self-pay | Admitting: Family Medicine

## 2014-06-12 ENCOUNTER — Emergency Department (INDEPENDENT_AMBULATORY_CARE_PROVIDER_SITE_OTHER)
Admission: EM | Admit: 2014-06-12 | Discharge: 2014-06-12 | Disposition: A | Discharge status: Home / Self Care | Payer: Self-pay | Source: Home / Self Care | Attending: Emergency Medicine | Admitting: Emergency Medicine

## 2014-06-12 ENCOUNTER — Encounter (HOSPITAL_COMMUNITY): Payer: Self-pay | Admitting: Emergency Medicine

## 2014-06-12 DIAGNOSIS — S29012A Strain of muscle and tendon of back wall of thorax, initial encounter: Secondary | ICD-10-CM

## 2014-06-12 MED ORDER — DICLOFENAC SODIUM 1 % TD GEL: 1.0000 "application " | Freq: Four times a day (QID) | TRANSDERMAL | Status: DC

## 2014-06-12 MED ORDER — KETOROLAC TROMETHAMINE 10 MG PO TABS: 10.0000 mg | ORAL_TABLET | Freq: Four times a day (QID) | ORAL | Status: DC | PRN

## 2014-06-12 NOTE — ED Provider Notes (Signed)
CSN: [id number]     Arrival date & time 06/12/14  1750 History   First MD Initiated Contact with Patient 06/12/14 1757     Chief Complaint  Patient presents with  . Back Pain   (Consider location/radiation/quality/duration/timing/severity/associated sxs/prior Treatment) HPI Comments: 24 year old female left work approximately 11 PM last night. Approximate 12 PM she experienced sudden abrupt pain to the right mid back. Pain is worse with movement and is tender. The pain effects are movement in that abduction is limited due to pain. Denies shoulder pain or neck pain. Denies known injury. 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   Past Medical History  Diagnosis Date  . Hypertension   . HQIONGEX(528.4Headache(784.0)    Past Surgical History  Procedure Laterality Date  . Cholecystectomy    . Wisdom tooth extraction    . Tubal ligation  07/2010    got pregnant in 2013 despite tubal so inserted nexplanon 06/2012  . Nexplanon Left 07/03/2012    [id number]   Family History  Problem Relation Age of Onset  . Asthma Maternal Grandmother   . COPD Maternal Grandmother    History  Substance Use Topics  . Smoking status: Never Smoker   . Smokeless tobacco: Not on file  . Alcohol Use: No   OB History    Gravida Para Term Preterm AB TAB SAB Ectopic Multiple Living   4 3 3  0 1 0 1 0 0 3     Review of Systems  Constitutional: Negative for fever, chills and activity change.  HENT: Negative.   Respiratory: Negative.   Cardiovascular: Negative.   Musculoskeletal: Positive for myalgias and back pain.       As per HPI  Skin: Negative for color change, pallor and rash.  Neurological: Negative.     Allergies  Review of patient's allergies indicates no known allergies.  Home Medications   Prior to Admission medications   Medication Sig Start Date End Date Taking? Authorizing Provider  diclofenac sodium  (VOLTAREN) 1 % GEL Apply 1 application topically 4 (four) times daily. 06/12/14   Hayden Rasmussenavid Royale Lennartz, NP  ketorolac (TORADOL) 10 MG tablet Take 1 tablet (10 mg total) by mouth 4 (four) times daily as needed. 06/12/14   Hayden Rasmussenavid Lateef Juncaj, NP  traMADol (ULTRAM) 50 MG tablet Take 1 tablet (50 mg total) by mouth every 8 (eight) hours as needed for pain. 09/19/12   Brent BullaErik D Ritch, MD   BP 157/97 mmHg  Pulse 66  Temp(Src) 98 F (36.7 C) (Oral)  Resp 16  SpO2 98% Physical Exam  Constitutional: She is oriented to person, place, and time. She appears well-developed and well-nourished.  Neck: Normal range of motion. Neck supple.  Pulmonary/Chest: Effort normal and breath sounds normal. No respiratory distress.  Musculoskeletal: She exhibits edema.  Direct tenderness over the right rhomboideus. Active range of motion is limited to 90. There is no shoulder pain. Abduction produces pain in the rhomboid. No thoracic spine tenderness or deformity. No cervical spinal tenderness or deformity. Full range of motion of the neck head. Distal neurovascular motor Sentry is intact.  Lymphadenopathy:    She has no cervical adenopathy.  Neurological: She is alert and oriented to person, place, and time.  Skin: Skin is warm and dry.  Psychiatric: She has a normal mood and affect.  Nursing note and vitals reviewed.   ED Course  Procedures (including critical  care time) Labs Review Labs Reviewed - No data to display  Imaging Review No results found.   MDM   1. Rhomboid muscle strain, initial encounter    Ice for first 1-2 days, then heat Diclofenac gel toradol for pain Arm sling for 2-3 days.    Hayden Rasmussen, NP 06/12/14 1818

## 2014-06-12 NOTE — ED Notes (Signed)
C/o  Right upper back pain.  On set last night.  Pain with raising arm above head.  No relief with warm compresses.

## 2014-06-12 NOTE — Discharge Instructions (Signed)
Back Pain, Adult °Low back pain is very common. About 1 in 5 people have back pain. The cause of low back pain is rarely dangerous. The pain often gets better over time. About half of people with a sudden onset of back pain feel better in just 2 weeks. About 8 in 10 people feel better by 6 weeks.  °CAUSES °Some common causes of back pain include: °· Strain of the muscles or ligaments supporting the spine. °· Wear and tear (degeneration) of the spinal discs. °· Arthritis. °· Direct injury to the back. °DIAGNOSIS °Most of the time, the direct cause of low back pain is not known. However, back pain can be treated effectively even when the exact cause of the pain is unknown. Answering your caregiver's questions about your overall health and symptoms is one of the most accurate ways to make sure the cause of your pain is not dangerous. If your caregiver needs more information, he or she may order lab work or imaging tests (X-rays or MRIs). However, even if imaging tests show changes in your back, this usually does not require surgery. °HOME CARE INSTRUCTIONS °For many people, back pain returns. Since low back pain is rarely dangerous, it is often a condition that people can learn to manage on their own.  °· Remain active. It is stressful on the back to sit or stand in one place. Do not sit, drive, or stand in one place for more than 30 minutes at a time. Take short walks on level surfaces as soon as pain allows. Try to increase the length of time you walk each day. °· Do not stay in bed. Resting more than 1 or 2 days can delay your recovery. °· Do not avoid exercise or work. Your body is made to move. It is not dangerous to be active, even though your back may hurt. Your back will likely heal faster if you return to being active before your pain is gone. °· Pay attention to your body when you  bend and lift. Many people have less discomfort when lifting if they bend their knees, keep the load close to their bodies, and  avoid twisting. Often, the most comfortable positions are those that put less stress on your recovering back. °· Find a comfortable position to sleep. Use a firm mattress and lie on your side with your knees slightly bent. If you lie on your back, put a pillow under your knees. °· Only take over-the-counter or prescription medicines as directed by your caregiver. Over-the-counter medicines to reduce pain and inflammation are often the most helpful. Your caregiver may prescribe muscle relaxant drugs. These medicines help dull your pain so you can more quickly return to your normal activities and healthy exercise. °· Put ice on the injured area. °¨ Put ice in a plastic bag. °¨ Place a towel between your skin and the bag. °¨ Leave the ice on for 15-20 minutes, 03-04 times a day for the first 2 to 3 days. After that, ice and heat may be alternated to reduce pain and spasms. °· Ask your caregiver about trying back exercises and gentle massage. This may be of some benefit. °· Avoid feeling anxious or stressed. Stress increases muscle tension and can worsen back pain. It is important to recognize when you are anxious or stressed and learn ways to manage it. Exercise is a great option. °SEEK MEDICAL CARE IF: °· You have pain that is not relieved with rest or medicine. °· You have pain that does not improve in 1 week. °· You have new symptoms. °· You are generally not feeling well. °SEEK   IMMEDIATE MEDICAL CARE IF:  °· You have pain that radiates from your back into your legs. °· You develop new bowel or bladder control problems. °· You have unusual weakness or numbness in your arms or legs. °· You develop nausea or vomiting. °· You develop abdominal pain. °· You feel faint. °Document Released: 04/16/2005 Document Revised: 10/16/2011 Document Reviewed: 08/18/2013 °Exit[employer]® Patient Information ©2015 [employer]. This information is not intended to replace advice given to you by your health care provider. Make sure you  discuss any questions you have with your health care provider. °Muscle Strain °A muscle strain is an injury that occurs when a muscle is stretched beyond its normal length. Usually a small number of muscle fibers are torn when this happens. Muscle strain is rated in degrees. First-degree strains have the least amount of muscle fiber tearing and pain. Second-degree and third-degree strains have increasingly more tearing and pain.  °Usually, recovery from muscle strain takes 1-2 weeks. Complete healing takes 5-6 weeks.  °CAUSES  °Muscle strain happens when a sudden, violent force placed on a muscle stretches it too far. This may occur with lifting, sports, or a fall.  °RISK FACTORS °Muscle strain is especially common in athletes.  °SIGNS AND SYMPTOMS °At the site of the muscle strain, there may be: °· Pain. °· Bruising. °· Swelling. °· Difficulty using the muscle due to pain or lack of normal function. °DIAGNOSIS  °Your health care provider will perform a physical exam and ask about your medical history. °TREATMENT  °Often, the best treatment for a muscle strain is resting, icing, and applying cold compresses to the injured area.   °HOME CARE INSTRUCTIONS  °· Use the PRICE method of treatment to promote muscle healing during the first 2-3 days after your injury. The PRICE method involves: °¨ Protecting the muscle from being injured again. °¨ Restricting your activity and resting the injured body part. °¨ Icing your injury. To do this, put ice in a plastic bag. Place a towel between your skin and the bag. Then, apply the ice and leave it on from 15-20 minutes each hour. After the third day, switch to moist heat packs. °¨ Apply compression to the injured area with a splint or elastic bandage. Be careful not to wrap it too tightly. This may interfere with blood circulation or increase swelling. °¨ Elevate the injured body part above the level of your heart as often as you can. °· Only take over-the-counter or  prescription medicines for pain, discomfort, or fever as directed by your health care provider. °· Warming up prior to exercise helps to prevent future muscle strains. °SEEK MEDICAL CARE IF:  °· You have increasing pain or swelling in the injured area. °· You have numbness, tingling, or a significant loss of strength in the injured area. °MAKE SURE YOU:  °· Understand these instructions. °· Will watch your condition. °· Will get help right away if you are not doing well or get worse. °Document Released: 04/16/2005 Document Revised: 02/04/2013 Document Reviewed: 11/13/2012 °ExitCare® Patient Information ©2015 [employer]. This information is not intended to replace advice given to you by your health care provider. Make sure you discuss any questions you have with your health care provider. ° °

## 2014-09-21 ENCOUNTER — Encounter (HOSPITAL_COMMUNITY): Payer: Self-pay | Admitting: Emergency Medicine

## 2014-09-21 ENCOUNTER — Emergency Department (HOSPITAL_COMMUNITY)
Admission: EM | Admit: 2014-09-21 | Discharge: 2014-09-21 | Disposition: A | Discharge status: Home / Self Care | Payer: Self-pay | Source: Home / Self Care | Attending: Emergency Medicine | Admitting: Emergency Medicine

## 2014-09-21 DIAGNOSIS — J039 Acute tonsillitis, unspecified: Secondary | ICD-10-CM

## 2014-09-21 LAB — POCT RAPID STREP A: Streptococcus, Group A Screen (Direct): NEGATIVE

## 2014-09-21 MED ORDER — CLINDAMYCIN HCL 300 MG PO CAPS: 300.0000 mg | ORAL_CAPSULE | Freq: Three times a day (TID) | ORAL | Status: DC

## 2014-09-21 NOTE — Discharge Instructions (Signed)
You have an infection of your tonsils. Take clindamycin 1 pill 3 times a day for the next 10 days. With the coupon, [facility]. Continue salt water gargles for comfort. Tea with honey and Chloraseptic spray will also be soothing to the throat. If you develop fevers, cannot swallow your own spit, or cannot open your jaw, please go to the emergency room.

## 2014-09-21 NOTE — ED Provider Notes (Signed)
CSN: [id number]     Arrival date & time 09/21/14  0818 History   First MD Initiated Contact with Patient 09/21/14 [phone]     Chief Complaint  Patient presents with  . Sore Throat   (Consider location/radiation/quality/duration/timing/severity/associated sxs/prior Treatment) HPI She is a 24 year old woman here for evaluation of sore throat. Her symptoms started 2 days ago. Initially, the pain was only on the left side of her throat. In the last day, it has spread to involve all sides of the throat. The pain radiates to her ears. It is worse with swallowing. She does have some pain with opening and closing her jaw. She is able to tolerate liquids. She denies any fevers, nasal congestion, rhinorrhea, cough. She does report some intermittent chills.  Past Medical History  Diagnosis Date  . Hypertension   . [id number](956.2Headache(784.0)    Past Surgical History  Procedure Laterality Date  . Cholecystectomy    . Wisdom tooth extraction    . Tubal ligation  07/2010    got pregnant in 2013 despite tubal so inserted nexplanon 06/2012  . Nexplanon Left 07/03/2012    [id number]   Family History  Problem Relation Age of Onset  . Asthma Maternal Grandmother   . COPD Maternal Grandmother    History  Substance Use Topics  . Smoking status: Never Smoker   . Smokeless tobacco: Not on file  . Alcohol Use: No   OB History    Gravida Para Term Preterm AB TAB SAB Ectopic Multiple Living   4 3 3  0 1 0 1 0 0 3     Review of Systems As in history of present illness Allergies  Review of patient's allergies indicates no known allergies.  Home Medications   Prior to Admission medications   Medication Sig Start Date End Date Taking? Authorizing Provider  clindamycin (CLEOCIN) 300 MG capsule Take 1 capsule (300 mg total) by mouth 3 (three) times daily. 09/21/14   Charm RingsErin J Duran Ohern, MD  diclofenac sodium (VOLTAREN) 1 % GEL Apply 1 application topically 4 (four) times daily. 06/12/14   Hayden Rasmussenavid Mabe, NP  ketorolac  (TORADOL) 10 MG tablet Take 1 tablet (10 mg total) by mouth 4 (four) times daily as needed. 06/12/14   Hayden Rasmussenavid Mabe, NP  traMADol (ULTRAM) 50 MG tablet Take 1 tablet (50 mg total) by mouth every 8 (eight) hours as needed for pain. 09/19/12   Brent BullaErik D Ritch, MD   BP 121/72 mmHg  Pulse 72  Temp(Src) 97.9 F (36.6 C) (Oral)  Resp 16  SpO2 99% Physical Exam  Constitutional: She is oriented to person, place, and time. She appears well-developed and well-nourished. No distress.  HENT:  Right Ear: Tympanic membrane and external ear normal.  Left Ear: Tympanic membrane and external ear normal.  Nose: Nose normal.  Bilateral tonsils are erythematous with exudate. No clear abscess or edema. Uvula is slightly deviated to the right.  Eyes: Conjunctivae are normal.  Neck: Neck supple.  Cardiovascular: Normal rate.   Pulmonary/Chest: Effort normal.  Lymphadenopathy:    She has no cervical adenopathy.  Neurological: She is alert and oriented to person, place, and time.    ED Course  Procedures (including critical care time) Labs Review Labs Reviewed  POCT RAPID STREP A    Imaging Review No results found.   MDM   1. Tonsillitis    Rapid strep is negative. History and exam is concerning for bacterial tonsillitis.  I'm concerned for a possible developing tonsillar abscess, although there  are no clinical signs currently. Will start clindamycin 3 times a day for the next 10 days. Discussed symptomatic care with salt water gargles, Chloraseptic Spray, tea with honey. Return precautions reviewed including development of fevers, unable to manage her secretions, and unable to open jaw due to pain.    Charm Rings, MD 09/21/14 [phone]

## 2014-09-21 NOTE — ED Notes (Signed)
C/o  Sore throat since yesterday.  Pain with swallowing.  No relief with tylenol.  Denies fever, n/v/d

## 2014-09-23 LAB — CULTURE, GROUP A STREP

## 2014-10-04 NOTE — ED Notes (Signed)
Final report positive for strep, treated adequately w Rx provided

## 2015-12-10 ENCOUNTER — Encounter (HOSPITAL_COMMUNITY): Payer: Self-pay | Admitting: *Deleted

## 2015-12-10 ENCOUNTER — Emergency Department (HOSPITAL_COMMUNITY)
Admission: EM | Admit: 2015-12-10 | Discharge: 2015-12-10 | Disposition: A | Discharge status: Home / Self Care | Payer: Medicaid Other | Attending: Emergency Medicine | Admitting: Emergency Medicine

## 2015-12-10 DIAGNOSIS — R55 Syncope and collapse: Secondary | ICD-10-CM | POA: Insufficient documentation

## 2015-12-10 DIAGNOSIS — I10 Essential (primary) hypertension: Secondary | ICD-10-CM | POA: Insufficient documentation

## 2015-12-10 LAB — CBG MONITORING, ED: GLUCOSE-CAPILLARY: 119 mg/dL — AB (ref 65–99)

## 2015-12-10 LAB — I-STAT CHEM 8, ED
BUN: 6 mg/dL (ref 6–20)
CALCIUM ION: 1.11 mmol/L — AB (ref 1.13–1.30)
CHLORIDE: 105 mmol/L (ref 101–111)
Creatinine, Ser: 0.7 mg/dL (ref 0.44–1.00)
Glucose, Bld: 86 mg/dL (ref 65–99)
HCT: 40 % (ref 36.0–46.0)
HEMOGLOBIN: 13.6 g/dL (ref 12.0–15.0)
Potassium: 3.8 mmol/L (ref 3.5–5.1)
SODIUM: 139 mmol/L (ref 135–145)
TCO2: 22 mmol/L (ref 0–100)

## 2015-12-10 LAB — I-STAT BETA HCG BLOOD, ED (MC, WL, AP ONLY)

## 2015-12-10 NOTE — Discharge Instructions (Signed)
Return or see your primary care physician if concern for any reason. Make sure that you get lots of rest

## 2015-12-10 NOTE — ED Triage Notes (Signed)
Pt had a near syncopal episode while visiting her grand mother in the ICU. Pt states she started walking toward the visitor waiting area to get some air, she then started to feel lightheaded and dizzy. Pt states she went down to her knees. Pt did not hit her head, no LOC. Pt can remember the entire event. Pt states she feels a little lightheaded right now but not as bad when the episode happened.

## 2015-12-10 NOTE — ED Provider Notes (Signed)
[facility]-EMERGENCY DEPT Provider Note   CSN: [id number] Arrival date & time: 12/10/15  1842  First Provider Contact:  First MD Initiated Contact with Patient 12/10/15 1923        History   Chief Complaint Chief Complaint  Patient presents with  . Near Syncope    HPI Kendra [name] is a 25 y.o. female.She had syncopal event 5 PM today while visiting her grandmother in the intensive care unit. She felt hot and lightheaded immediately prior to the event. She denied any headache denied chest pain denies abdominal pain. She now complains of mild low back pain and bilateral knee pain since falling. No other complaint. She feels improved presently without treatment. She had similar episode in the past with a panic attack. No other associated symptoms  HPI  Past Medical History:  Diagnosis Date  . Headache(784.0)   . Hypertension     Patient Active Problem List   Diagnosis Date Noted  . Left knee pain 09/19/2012  . Panic attack 09/19/2012  . Headache(784.0) 11/07/2011  . Supervision of normal subsequent pregnancy 11/07/2011  . History of tubal ligation 09/25/2011  . Amenorrhea 09/25/2011  . OBESITY, NOS 06/27/2006    Past Surgical History:  Procedure Laterality Date  . CHOLECYSTECTOMY    . nexplanon Left 07/03/2012   [id number]  . TUBAL LIGATION  07/2010   got pregnant in 2013 despite tubal so inserted nexplanon 06/2012  . WISDOM TOOTH EXTRACTION      OB History    Gravida Para Term Preterm AB Living   0 1 3   SAB TAB Ectopic Multiple Live Births   1 0 0 0 3       Home Medications    Prior to Admission medications   Medication Sig Start Date End Date Taking? Authorizing Provider  clindamycin (CLEOCIN) 300 MG capsule Take 1 capsule (300 mg total) by mouth 3 (three) times daily. 09/21/14   Charm Rings, MD  diclofenac sodium (VOLTAREN) 1 % GEL Apply 1 application topically 4 (four) times daily. 06/12/14   Hayden Rasmussen, NP  ketorolac (TORADOL) 10 MG tablet Take 1  tablet (10 mg total) by mouth 4 (four) times daily as needed. 06/12/14   Hayden Rasmussen, NP  traMADol (ULTRAM) 50 MG tablet Take 1 tablet (50 mg total) by mouth every 8 (eight) hours as needed for pain. 09/19/12   Brent Bulla, MD    Family History Family History  Problem Relation Age of Onset  . Asthma Maternal Grandmother   . COPD Maternal Grandmother     Social History Social History  Substance Use Topics  . Smoking status: Never Smoker  . Smokeless tobacco: Not on file  . Alcohol use No     Allergies   Review of patient's allergies indicates no known allergies.   Review of Systems Review of Systems  Constitutional: Negative.   HENT: Negative.   Respiratory: Negative.   Cardiovascular: Negative.   Gastrointestinal: Negative.   Musculoskeletal: Positive for arthralgias and back pain.  Skin: Negative.   Neurological: Positive for light-headedness.  Psychiatric/Behavioral: Negative.   All other systems reviewed and are negative.    Physical Exam Updated Vital Signs BP 119/67   Pulse 66   Resp 21   Ht  (1.727 m)   Wt 259 lb (117.5 kg)   SpO2 97%   BMI 39.38 kg/m   Physical Exam  Constitutional: She appears well-developed and well-nourished.  HENT:  Head: Normocephalic and  atraumatic.  Eyes: Conjunctivae are normal. Pupils are equal, round, and reactive to light.  Neck: Neck supple. No tracheal deviation present. No thyromegaly present.  Cardiovascular: Normal rate and regular rhythm.   No murmur heard. Pulmonary/Chest: Effort normal and breath sounds normal.  Abdominal: Soft. Bowel sounds are normal. She exhibits no distension. There is no tenderness.  Obese  Musculoskeletal: Normal range of motion. She exhibits no edema or tenderness.  Tire spine nontender. All 4 extremities without swelling or deformity or bony tenderness. Neurovascular intact  Neurological: She is alert. Coordination normal.  Gait normal  Skin: Skin is warm and dry. No rash noted.    Psychiatric: She has a normal mood and affect.  Nursing note and vitals reviewed.    ED Treatments / Results  Labs (all labs ordered are listed, but only abnormal results are displayed) Labs Reviewed  CBG MONITORING, ED - Abnormal; Notable for the following:       Result Value   Glucose-Capillary 119 (*)    All other components within normal limits  I-STAT CHEM 8, ED - Abnormal; Notable for the following:    Calcium, Ion 1.11 (*)    All other components within normal limits  I-STAT BETA HCG BLOOD, ED ([facility], [facility], [facility] ONLY)    EKG  EKG Interpretation  Date/Time:  Saturday December 10 2015 20:14:40 EDT Ventricular Rate:  67 PR Interval:    QRS Duration: 89 QT Interval:  397 QTC Calculation: 420 R Axis:   89 Text Interpretation:  Sinus rhythm Consider left atrial enlargement No significant change since last tracing Confirmed by Ethelda ChickJACUBOWITZ  MD, Jalil Lorusso ([id number]) on 12/10/2015 9:02:37 PM       Radiology No results found.  Procedures Procedures (including critical care time)  Medications Ordered in ED Medications - No data to display   Initial Impression / Assessment and Plan / ED Course  I have reviewed the triage vital signs and the nursing notes.  Pertinent labs & imaging results that were available during my care of the patient were reviewed by me and considered in my medical decision making (see chart for details).  Clinical Course   Results for orders placed or performed during the hospital encounter of 12/10/15  POC CBG, ED  Result Value Ref Range   Glucose-Capillary 119 (H) 65 - 99 mg/dL  I-stat chem 8, ed  Result Value Ref Range   Sodium 139 135 - 145 mmol/L   Potassium 3.8 3.5 - 5.1 mmol/L   Chloride 105 101 - 111 mmol/L   BUN 6 6 - 20 mg/dL   Creatinine, Ser 6.040.70 0.44 - 1.00 mg/dL   Glucose, Bld 86 65 - 99 mg/dL   Calcium, Ion 5.401.11 (L) 1.13 - 1.30 mmol/L   TCO2 22 0 - 100 mmol/L   Hemoglobin 13.6 12.0 - 15.0 g/dL   HCT 98.140.0 19.136.0 - 47.846.0 %  I-Stat Beta hCG  blood, ED ([facility], [facility], [facility] only)  Result Value Ref Range   I-stat hCG, quantitative <5.0 <5 mIU/mL   Comment 3          9 PM patient continues to feel improved No results found. syncopefelt to be vasovagal in etiology plan return or see PMD if concern for any reason Final Clinical Impressions(s) / ED Diagnoses  Diagnosis syncope Final diagnoses:  None    New Prescriptions New Prescriptions   No medications on file     Doug SouSam Endrit Gittins, MD 12/10/15 2106

## 2016-04-30 NOTE — L&D Delivery Note (Signed)
Delivery Note At 6:50 AM a viable female was delivered via Vaginal, Spontaneous Delivery (Presentation: LOA).  APGAR: 9 ,9 ; weight pending.    Placenta status: Spontaneous, intact .  Cord: 3 vessels.  Anesthesia:  Epidural Episiotomy:  none Lacerations:  none Est. Blood Loss (mL): 100  Mom to postpartum.  Baby to Couplet care / Skin to Skin.  Cleone Slim 08/18/2016, 7:01 AM  Patient is a [id number] at [redacted]w[redacted]d who was admitted for IOL due to [facility], otherwise uncomplicated prenatal course.  She progressed with augmentation via cytotec, FB, and Pit.  I was gloved and present for delivery in its entirety.  Second stage of labor progressed, baby delivered after  15 min second stage.  Mild decels during second stage noted.  Complications: none  Lacerations: none  EBL: 100cc  Cam Hai, CNM 10:09 AM 08/18/2016

## 2016-06-11 ENCOUNTER — Inpatient Hospital Stay (HOSPITAL_COMMUNITY): Payer: Medicaid Other

## 2016-06-11 ENCOUNTER — Inpatient Hospital Stay (HOSPITAL_COMMUNITY)
Admission: AD | Admit: 2016-06-11 | Discharge: 2016-06-11 | Disposition: A | Discharge status: Home / Self Care | Payer: Medicaid Other | Source: Ambulatory Visit | Attending: Obstetrics and Gynecology | Admitting: Obstetrics and Gynecology

## 2016-06-11 ENCOUNTER — Encounter (HOSPITAL_COMMUNITY): Payer: Self-pay

## 2016-06-11 DIAGNOSIS — R1011 Right upper quadrant pain: Secondary | ICD-10-CM | POA: Insufficient documentation

## 2016-06-11 DIAGNOSIS — Z3A29 29 weeks gestation of pregnancy: Secondary | ICD-10-CM | POA: Diagnosis not present

## 2016-06-11 DIAGNOSIS — Z79899 Other long term (current) drug therapy: Secondary | ICD-10-CM | POA: Diagnosis not present

## 2016-06-11 DIAGNOSIS — O26893 Other specified pregnancy related conditions, third trimester: Secondary | ICD-10-CM | POA: Diagnosis not present

## 2016-06-11 LAB — COMPREHENSIVE METABOLIC PANEL
ALBUMIN: 2.9 g/dL — AB (ref 3.5–5.0)
ALT: 19 U/L (ref 14–54)
ANION GAP: 6 (ref 5–15)
AST: 16 U/L (ref 15–41)
Alkaline Phosphatase: 72 U/L (ref 38–126)
BILIRUBIN TOTAL: 0.8 mg/dL (ref 0.3–1.2)
BUN: 6 mg/dL (ref 6–20)
CHLORIDE: 107 mmol/L (ref 101–111)
CO2: 21 mmol/L — AB (ref 22–32)
Calcium: 8.3 mg/dL — ABNORMAL LOW (ref 8.9–10.3)
Creatinine, Ser: 0.46 mg/dL (ref 0.44–1.00)
GFR calc Af Amer: >60 mL/min (ref >60)
GFR calc non Af Amer: >60 mL/min (ref >60)
GLUCOSE: 83 mg/dL (ref 65–99)
POTASSIUM: 3.7 mmol/L (ref 3.5–5.1)
SODIUM: 134 mmol/L — AB (ref 135–145)
Total Protein: 6.3 g/dL — ABNORMAL LOW (ref 6.5–8.1)

## 2016-06-11 LAB — URINALYSIS, ROUTINE W REFLEX MICROSCOPIC
Bilirubin Urine: NEGATIVE
Glucose, UA: NEGATIVE mg/dL
HGB URINE DIPSTICK: NEGATIVE
KETONES UR: NEGATIVE mg/dL
Nitrite: NEGATIVE
PROTEIN: NEGATIVE mg/dL
Specific Gravity, Urine: 1.014 (ref 1.005–1.030)
pH: 7 (ref 5.0–8.0)

## 2016-06-11 LAB — PROTEIN / CREATININE RATIO, URINE
Creatinine, Urine: 126 mg/dL
Protein Creatinine Ratio: 0.08 mg/mg{Cre} (ref 0.00–0.15)
Total Protein, Urine: 10 mg/dL

## 2016-06-11 LAB — CBC
HEMATOCRIT: 34.3 % — AB (ref 36.0–46.0)
Hemoglobin: 11.8 g/dL — ABNORMAL LOW (ref 12.0–15.0)
MCH: 30.3 pg (ref 26.0–34.0)
MCHC: 34.4 g/dL (ref 30.0–36.0)
MCV: 88.2 fL (ref 78.0–100.0)
Platelets: 286 10*3/uL (ref 150–400)
RBC: 3.89 MIL/uL (ref 3.87–5.11)
RDW: 13.1 % (ref 11.5–15.5)
WBC: 9.2 10*3/uL (ref 4.0–10.5)

## 2016-06-11 MED ORDER — ACETAMINOPHEN 325 MG PO TABS
650.0000 mg | ORAL_TABLET | Freq: Four times a day (QID) | ORAL | Status: DC | PRN
  Administered 2016-06-11: 650 mg via ORAL
  Filled 2016-06-11: qty 2

## 2016-06-11 MED ORDER — CYCLOBENZAPRINE HCL 10 MG PO TABS
10.0000 mg | ORAL_TABLET | Freq: Once | ORAL | Status: AC
  Administered 2016-06-11: 10 mg via ORAL
  Filled 2016-06-11: qty 1

## 2016-06-11 NOTE — Discharge Instructions (Signed)
Hypertension During Pregnancy °Hypertension, commonly called high blood pressure, is when the force of blood pumping through your arteries is too strong. Arteries are blood vessels that carry blood from the heart throughout the body. Hypertension during pregnancy can cause problems for you and your baby. Your baby may be born early (prematurely) or may not weigh as much as he or she should at birth. Very bad cases of hypertension during pregnancy can be life-threatening. °Different types of hypertension can occur during pregnancy. These include: °· Chronic hypertension. This happens when: °¨ You have hypertension before pregnancy and it continues during pregnancy. °¨ You develop hypertension before you are [redacted] weeks pregnant, and it continues during pregnancy. °· Gestational hypertension. This is hypertension that develops after the 20th week of pregnancy. °· Preeclampsia, also called toxemia of pregnancy. This is a very serious type of hypertension that develops only during pregnancy. It affects the whole body, and it can be very dangerous for you and your baby. °Gestational hypertension and preeclampsia usually go away within 6 weeks after your baby is born. Women who have hypertension during pregnancy have a greater chance of developing hypertension later in life or during future pregnancies. °What are the causes? °The exact cause of hypertension is not known. °What increases the risk? °There are certain factors that make it more likely for you to develop hypertension during pregnancy. These include: °· Having hypertension during a previous pregnancy or prior to pregnancy. °· Being overweight. °· Being older than age 40. °· Being pregnant for the first time or being pregnant with more than one baby. °· Becoming pregnant using fertilization methods such as IVF (in vitro fertilization). °· Having diabetes, kidney problems, or systemic lupus erythematosus. °· Having a family history of hypertension. °What are the  signs or symptoms? °Chronic hypertension and gestational hypertension rarely cause symptoms. Preeclampsia causes symptoms, which may include: °· Increased protein in your urine. Your health care provider will check for this at every visit before you give birth (prenatal visit). °· Severe headaches. °· Sudden weight gain. °· Swelling of the hands, face, legs, and feet. °· Nausea and vomiting. °· Vision problems, such as blurred or double vision. °· Numbness in the face, arms, legs, and feet. °· Dizziness. °· Slurred speech. °· Sensitivity to bright lights. °· Abdominal pain. °· Convulsions. °How is this diagnosed? °You may be diagnosed with hypertension during a routine prenatal exam. At each prenatal visit, you may: °· Have a urine test to check for high amounts of protein in your urine. °· Have your blood pressure checked. A blood pressure reading is recorded as two numbers, such as "120 over 80" (or 120/80). The first ("top") number is called the systolic pressure. It is a measure of the pressure in your arteries when your heart beats. The second ("bottom") number is called the diastolic pressure. It is a measure of the pressure in your arteries as your heart relaxes between beats. Blood pressure is measured in a unit called mm Hg. A normal blood pressure reading is: °¨ Systolic: below 120. °¨ Diastolic: below 80. °The type of hypertension that you are diagnosed with depends on your test results and when your symptoms developed. °· Chronic hypertension is usually diagnosed before 20 weeks of pregnancy. °· Gestational hypertension is usually diagnosed after 20 weeks of pregnancy. °· Hypertension with high amounts of protein in the urine is diagnosed as preeclampsia. °· Blood pressure measurements that stay above 160 systolic, or above 110 diastolic, are signs of severe preeclampsia. °  How is this treated? Treatment for hypertension during pregnancy varies depending on the type of hypertension you have and how  serious it is.  If you take medicines called ACE inhibitors to treat chronic hypertension, you may need to switch medicines. ACE inhibitors should not be taken during pregnancy.  If you have gestational hypertension, you may need to take blood pressure medicine.  If you are at risk for preeclampsia, your health care provider may recommend that you take a low-dose aspirin every day to prevent high blood pressure during your pregnancy.  If you have severe preeclampsia, you may need to be hospitalized so you and your baby can be monitored closely. You may also need to take medicine (magnesium sulfate) to prevent seizures and to lower blood pressure. This medicine may be given as an injection or through an IV tube.  In some cases, if your condition gets worse, you may need to deliver your baby early. Follow these instructions at home: Eating and drinking  Drink enough fluid to keep your urine clear or pale yellow.  Eat a healthy diet that is low in salt (sodium). Do not add salt to your food. Check food labels to see how much sodium a food or beverage contains. Lifestyle  Do not use any products that contain nicotine or tobacco, such as cigarettes and e-cigarettes. If you need help quitting, ask your health care provider.  Do not use alcohol.  Avoid caffeine.  Avoid stress as much as possible. Rest and get plenty of sleep. General instructions  Take over-the-counter and prescription medicines only as told by your health care provider.  While lying down, lie on your left side. This keeps pressure off your baby.  While sitting or lying down, raise (elevate) your feet. Try putting some pillows under your lower legs.  Exercise regularly. Ask your health care provider what kinds of exercise are best for you.  Keep all prenatal and follow-up visits as told by your health care provider. This is important. Contact a health care provider if:  You have symptoms that your health care provider  told you may require more treatment or monitoring, such as:  Fever.  Vomiting.  Headache. Get help right away if:  You have severe abdominal pain or vomiting that does not get better with treatment.  You suddenly develop swelling in your hands, ankles, or face.  You gain 4 lbs (1.8 kg) or more in 1 week.  You develop vaginal bleeding, or you have blood in your urine.  You do not feel your baby moving as much as usual.  You have blurred or double vision.  You have muscle twitching or sudden tightening (spasms).  You have shortness of breath.  Your lips or fingernails turn blue. This information is not intended to replace advice given to you by your health care provider. Make sure you discuss any questions you have with your health care provider. Document Released: 01/02/2011 Document Revised: 11/04/2015 Document Reviewed: 09/30/2015 Elsevier Interactive Patient Education  2017 [employer]    [facility]  [address] [place], [place] [id number] [phone]

## 2016-06-11 NOTE — MAU Provider Note (Signed)
Patient Kendra [name] is a 26 year old G5P3013 at 29 weeks and 3 days here with complaints of right-sided abdominal pain that started this morning at 3:30 am. She has not had this pain in the past. She denies epigastric pain, blurry vision, headache, vaginal bleeding or leaking of fluid. She endorses positive fetal movements.  [place] and has not established her prenatal care yet.  History     CSN: [id number]  Arrival date and time: 06/11/16 0903   None     Chief Complaint  Patient presents with  . Abdominal Pain   Abdominal Pain  This is a new problem. The current episode started today. The onset quality is sudden. The problem occurs constantly. The problem has been unchanged. The pain is located in the RLQ and RUQ. The pain is at a severity of 5/10. The quality of the pain is aching. The abdominal pain does not radiate. Pertinent negatives include no anorexia, arthralgias, belching, constipation, diarrhea, dysuria, fever, flatus, frequency, headaches, hematochezia, hematuria, melena, myalgias, nausea, vomiting or weight loss. The pain is aggravated by movement. The pain is relieved by recumbency. She has tried nothing for the symptoms.    OB History    Gravida Para Term Preterm AB Living   5 3 3  0 1 3   SAB TAB Ectopic Multiple Live Births   1 0 0 0 3      Past Medical History:  Diagnosis Date  . Headache(784.0)   . Hypertension     Past Surgical History:  Procedure Laterality Date  . CHOLECYSTECTOMY    . nexplanon Left 07/03/2012   [id number]  . TUBAL LIGATION  07/2010   got pregnant in 2013 despite tubal so inserted nexplanon 06/2012  . WISDOM TOOTH EXTRACTION      Family History  Problem Relation Age of Onset  . Asthma Maternal Grandmother   . COPD Maternal Grandmother     Social History  Substance Use Topics  . Smoking status: Never Smoker  . Smokeless tobacco: Never Used  . Alcohol use No    Allergies: No Known  Allergies  Prescriptions Prior to Admission  Medication Sig Dispense Refill Last Dose  . Prenatal Vit-Fe Fumarate-FA (PRENATAL MULTIVITAMIN) TABS tablet Take 1 tablet by mouth daily at 12 noon.   05/28/2016    Review of Systems  Constitutional: Negative for fever and weight loss.  HENT: Negative.   Eyes: Negative.   Respiratory: Negative.   Cardiovascular: Negative.   Gastrointestinal: Positive for abdominal pain. Negative for anorexia, constipation, diarrhea, flatus, hematochezia, melena, nausea and vomiting.  Endocrine: Negative.   Genitourinary: Negative for dysuria, frequency, hematuria, vaginal bleeding, vaginal discharge and vaginal pain.  Musculoskeletal: Negative for arthralgias and myalgias.  Neurological: Negative.  Negative for headaches.  Psychiatric/Behavioral: Negative.    Physical Exam   Blood pressure 133/85, pulse 90, temperature 98.3 F (36.8 C), resp. rate 18.  Physical Exam  Constitutional: She is oriented to person, place, and time. She appears well-developed and well-nourished.  Eyes: Pupils are equal, round, and reactive to light.  Neck: Normal range of motion.  Respiratory: Effort normal. No respiratory distress. She has no wheezes. She has no rales. She exhibits no tenderness.  GI: Soft. She exhibits no distension and no mass. There is no tenderness. There is no rebound and no guarding.  Musculoskeletal: Normal range of motion.  Neurological: She is alert and oriented to person, place, and time.  Skin: Skin is warm and dry.  MAU Course  Procedures  MDM -CBC, UPC  and CMP: normal -patient had pressures in the 130/140/ over 80/90 in MAU. Two pressures 156/102 and 152/87. (Pressure of 178/105 and 167/99 taken when blood pressure cuff had been displaced) -given that patient is late to care here, patient to have growth [place]. Growth [place] normal today and NST reactive with (135 BPM, + acc, - dec, moderate variability)  Assessment and Plan   1. Right upper  quadrant abdominal pain   2. [redacted] weeks gestation of pregnancy    2. Patient feeling much better after Flexeril and Tylenol. Patient stable for D/C and appointment sent for NOB visit on 06/14/2016 at [facility] office. Reviewed warning signs, when to return to MAU (decreased fetal movements, bleeding or leaking of fluid)    Charlesetta Garibaldi Antwine Agosto CNM 06/11/2016, 10:26 AM

## 2016-06-11 NOTE — MAU Note (Signed)
Pt has been having right sided pain since 0330. Feeling a little dizzy and feels likes she's starting to get a headache.

## 2016-06-14 ENCOUNTER — Ambulatory Visit (INDEPENDENT_AMBULATORY_CARE_PROVIDER_SITE_OTHER): Payer: Self-pay | Admitting: Student

## 2016-06-14 DIAGNOSIS — Z3483 Encounter for supervision of other normal pregnancy, third trimester: Secondary | ICD-10-CM

## 2016-06-14 DIAGNOSIS — Z23 Encounter for immunization: Secondary | ICD-10-CM

## 2016-06-14 DIAGNOSIS — O0990 Supervision of high risk pregnancy, unspecified, unspecified trimester: Secondary | ICD-10-CM | POA: Insufficient documentation

## 2016-06-14 NOTE — Progress Notes (Addendum)
   PRENATAL VISIT NOTE  Subjective:  Kendra LevinsJasmine F [name] is a 26 y.o. [phone] at 7439w6d being seen today for ongoing prenatal care.  She is currently monitored for the following issues for this low-risk pregnancy and has Supervision of normal pregnancy in third trimester on her problem list.  Patient reports feeling better than when she was in the MAU on 06-11-2016. She has found relief from side pain through heat and rest. .  Contractions: Not present. Vag. Bleeding: None.  Movement: Present. Denies leaking of fluid.   The following portions of the patient's history were reviewed and updated as appropriate: allergies, current medications, past family history, past medical history, past social history, past surgical history and problem list. Problem list updated.  Objective:   Vitals:   06/14/16 1344  BP: 124/81  Pulse: 87  Weight: 279 lb (126.6 kg)    Fetal Status: Fetal Heart Rate (bpm): 143 Fundal Height: 31 cm Movement: Present     General:  Alert, oriented and cooperative. Patient is in no acute distress.  Skin: Skin is warm and dry. No rash noted.   Cardiovascular: Normal heart rate noted  Respiratory: Normal respiratory effort, no problems with respiration noted  Abdomen: Soft, gravid, appropriate for gestational age. Pain/Pressure: Present     Pelvic:  Cervical exam deferred        Extremities: Normal range of motion.  Edema: None  Mental Status: Normal mood and affect. Normal behavior. Normal judgment and thought content.   Assessment and Plan:  Pregnancy: [id number] at 5839w6d  1. Encounter for supervision of other normal pregnancy in third trimester -Normal anatomy based on note in Care Everywhere on 05-01-2016; growth US on 2-13 fetal in the 73% - Tdap vaccine greater than or equal to 7yo IM -Will draw prenatal labs and do 2 hour GTT on 06-18-2016 -Prenatal visit in two weeks  -Reviewed comfort measures for round ligament pain and side pain -High blood pressure now resolved  after elevation in MAU  Preterm labor symptoms and general obstetric precautions including but not limited to vaginal bleeding, contractions, leaking of fluid and fetal movement were reviewed in detail with the patient. Please refer to After Visit Summary for other counseling recommendations.  Return in about 4 days (around 06/18/2016) for blood work abnd 2 hour gtt.   Marylene LandKathryn Lorraine Kaden Dunkel, CNM

## 2016-06-14 NOTE — Addendum Note (Signed)
Addended by: Chrystie NoseKOOISTRA, Laiah Pouncey L on: 06/14/2016 03:29 PM   Modules accepted: Level of Service

## 2016-06-14 NOTE — Patient Instructions (Signed)
Glucose Tolerance Test The glucose tolerance test (GTT) is one of several tests used to diagnose diabetes mellitus. The GTT is a blood test, and it may include a urine test as well. The GTT checks to see how your body processes sugar (glucose). For this test, you will consume a drink containing a high level of glucose. Your blood glucose levels will be checked before you consume the drink and then again 1, 2, 3, and possibly 4 hours after you consume it. Your health care provider may recommend that you have the GTT if you:  Have a family history of diabetes.  Are very overweight (obese).  Have experienced infections that keep coming back.  Have had numerous cuts or wounds that did not heal quickly, especially on your legs and feet.  Are a woman and have a history of giving birth to very large babies or a history of repeated fetal loss (stillbirth).  Have had glucose in your urine or high blood sugar: ? During pregnancy. ? After a heart attack, surgery, or prolonged periods of high stress.  The GTT lasts 3-4 hours. Other than the glucose solution, you will not be allowed to eat or drink anything during the test. You must remain at the testing location to make sure that your blood and urine samples are taken on time. How do I prepare for this test? Eat normally for 3 days prior to the GTT test, including having plenty of carbohydrate-rich foods. Do not eat or drink anything except water during the final 12 hours before the test. You should not smoke or exercise during the test. In addition, your health care provider may ask you to stop taking certain medicines before the test. What do the results mean? It is your responsibility to obtain your test results. Ask the lab or department performing the test when and how you will get your results. Contact your health care provider to discuss any questions you have about your results. Range of Normal Values Ranges for normal values may vary among  different labs and hospitals. You should always check with your health care provider after having lab work or other tests done to discuss whether your values are considered within normal limits. Normal levels of blood glucose are as follows:  Fasting: less than 110 mg/dL or less than 6.1 mmol/L (SI units).  1 hour after consuming the glucose drink: less than 200 mg/dL or less than 11.1 mmol/L.  2 hours after consuming the glucose drink: less than 140 mg/dL or less than 7.8 mmol/L.  3 hours after consuming the glucose drink: 70-115 mg/dL or less than 6.4 mmol/L.  4 hours after consuming the glucose drink: 70-115 mg/dL or less than 6.4 mmol/L.  The normal result for the urine test is negative, meaning that glucose is absent from your urine. Some substances can interfere with GTT results. These may include:  Blood pressure and heart failure medicines, including beta blockers, furosemide, and thiazides.  Anti-inflammatory medicines, including aspirin.  Nicotine.  Some psychiatric medicines.  Oral contraceptives.  Diuretics or corticosteroids.  Meaning of Results Outside Normal Value Ranges GTT test results that are above normal values may indicate health problems, such as:  Diabetes mellitus.  Acute stress response.  Cushing syndrome.  Tumors such as pheochromocytoma or glucagonoma.  Chronic renal failure.  Pancreatitis.  Hyperthyroidism.  Current infection.  Discuss your test results with your health care provider. He or she will use the results to make a diagnosis and determine a treatment plan   that is right for you. Talk with your health care provider to discuss your results, treatment options, and if necessary, the need for more tests. Talk with your health care provider if you have any questions about your results. This information is not intended to replace advice given to you by your health care provider. Make sure you discuss any questions you have with your  health care provider. Document Released: 05/09/2004 Document Revised: 12/20/2015 Document Reviewed: 08/21/2013 Elsevier Interactive Patient Education  2017 Elsevier Inc.  

## 2016-06-18 ENCOUNTER — Other Ambulatory Visit (INDEPENDENT_AMBULATORY_CARE_PROVIDER_SITE_OTHER): Payer: Medicaid Other | Admitting: *Deleted

## 2016-06-18 DIAGNOSIS — Z3483 Encounter for supervision of other normal pregnancy, third trimester: Secondary | ICD-10-CM

## 2016-06-18 NOTE — Progress Notes (Signed)
Pt is here today for prenatal labs and 2 hr GTT.

## 2016-06-25 LAB — OBSTETRIC PANEL, INCLUDING HIV
Antibody Screen: NEGATIVE
BASOS ABS: 0 10*3/uL (ref 0.0–0.2)
Basos: 0 %
EOS (ABSOLUTE): 0.1 10*3/uL (ref 0.0–0.4)
Eos: 1 %
HEP B S AG: NEGATIVE
HIV SCREEN 4TH GENERATION: NONREACTIVE
Hematocrit: 35.9 % (ref 34.0–46.6)
Hemoglobin: 11.8 g/dL (ref 11.1–15.9)
IMMATURE GRANULOCYTES: 0 %
Immature Grans (Abs): 0 10*3/uL (ref 0.0–0.1)
LYMPHS ABS: 2.5 10*3/uL (ref 0.7–3.1)
Lymphs: 25 %
MCH: 30.4 pg (ref 26.6–33.0)
MCHC: 32.9 g/dL (ref 31.5–35.7)
MCV: 93 fL (ref 79–97)
MONOCYTES: 5 %
Monocytes Absolute: 0.5 10*3/uL (ref 0.1–0.9)
NEUTROS ABS: 6.9 10*3/uL (ref 1.4–7.0)
Neutrophils: 69 %
Platelets: 285 10*3/uL (ref 150–379)
RBC: 3.88 x10E6/uL (ref 3.77–5.28)
RDW: 13.8 % (ref 12.3–15.4)
RPR: NONREACTIVE
Rh Factor: POSITIVE
Rubella Antibodies, IGG: 2.33 index (ref >0.99)
WBC: 10.1 10*3/uL (ref 3.4–10.8)

## 2016-06-25 LAB — HEMOGLOBINOPATHY EVALUATION
HGB A: 97.7 % (ref 96.4–98.8)
HGB C: 0 %
HGB S: 0 %
HGB VARIANT: 0 %
Hemoglobin A2 Quantitation: 2.3 % (ref 1.8–3.2)
Hemoglobin F Quantitation: 0 % (ref 0.0–2.0)

## 2016-06-25 LAB — GLUCOSE TOLERANCE, 2 HOURS W/ 1HR
GLUCOSE, 1 HOUR: 115 mg/dL (ref 65–179)
Glucose, 2 hour: 92 mg/dL (ref 65–152)
Glucose, Fasting: 71 mg/dL (ref 65–91)

## 2016-06-25 LAB — URINE CULTURE, OB REFLEX

## 2016-06-25 LAB — CULTURE, OB URINE

## 2016-06-25 LAB — CYSTIC FIBROSIS MUTATION 97: GENE DIS ANAL CARRIER INTERP BLD/T-IMP: NOT DETECTED

## 2016-06-28 ENCOUNTER — Ambulatory Visit (INDEPENDENT_AMBULATORY_CARE_PROVIDER_SITE_OTHER): Payer: Self-pay | Admitting: Advanced Practice Midwife

## 2016-06-28 ENCOUNTER — Other Ambulatory Visit (HOSPITAL_COMMUNITY)
Admission: RE | Admit: 2016-06-28 | Discharge: 2016-06-28 | Disposition: A | Discharge status: Home / Self Care | Payer: Medicaid Other | Source: Ambulatory Visit | Attending: Advanced Practice Midwife | Admitting: Advanced Practice Midwife

## 2016-06-28 VITALS — BP 123/78 | HR 91 | Wt 283.0 lb

## 2016-06-28 DIAGNOSIS — O479 False labor, unspecified: Secondary | ICD-10-CM

## 2016-06-28 DIAGNOSIS — O4703 False labor before 37 completed weeks of gestation, third trimester: Secondary | ICD-10-CM

## 2016-06-28 DIAGNOSIS — O26893 Other specified pregnancy related conditions, third trimester: Secondary | ICD-10-CM | POA: Insufficient documentation

## 2016-06-28 DIAGNOSIS — M549 Dorsalgia, unspecified: Secondary | ICD-10-CM | POA: Diagnosis not present

## 2016-06-28 DIAGNOSIS — O9989 Other specified diseases and conditions complicating pregnancy, childbirth and the puerperium: Principal | ICD-10-CM

## 2016-06-28 DIAGNOSIS — Z3A31 31 weeks gestation of pregnancy: Secondary | ICD-10-CM | POA: Insufficient documentation

## 2016-06-28 DIAGNOSIS — Z3483 Encounter for supervision of other normal pregnancy, third trimester: Secondary | ICD-10-CM

## 2016-06-28 DIAGNOSIS — O4700 False labor before 37 completed weeks of gestation, unspecified trimester: Secondary | ICD-10-CM

## 2016-06-28 DIAGNOSIS — O99891 Other specified diseases and conditions complicating pregnancy: Secondary | ICD-10-CM

## 2016-06-28 LAB — POCT URINALYSIS DIPSTICK
Bilirubin, UA: NEGATIVE
GLUCOSE UA: NEGATIVE
Ketones, UA: NEGATIVE
Leukocytes, UA: NEGATIVE
NITRITE UA: NEGATIVE
PH UA: 6.5
Protein, UA: NEGATIVE
RBC UA: NEGATIVE
SPEC GRAV UA: 1.02
UROBILINOGEN UA: 1

## 2016-06-28 MED ORDER — CYCLOBENZAPRINE HCL 10 MG PO TABS: 10.0000 mg | ORAL_TABLET | Freq: Two times a day (BID) | ORAL | 1 refills | Status: DC | PRN

## 2016-06-28 NOTE — Addendum Note (Signed)
Addended by: Gita KudoLASSITER, KRISTEN S on: 06/28/2016 01:53 PM   Modules accepted: Orders

## 2016-06-28 NOTE — Progress Notes (Signed)
   PRENATAL VISIT NOTE  Subjective:  Kendra [name] is a 26 y.o. [phone] at 2863w6d being seen today for ongoing prenatal care.  She is currently monitored for the following issues for this low-risk pregnancy and has Supervision of normal pregnancy in third trimester on her problem list.  Patient reports occasional contractions and occassional severe right low back pain radiating down right leg. .  Contractions: Not present. Vag. Bleeding: None.  Movement: Present. Denies leaking of fluid.   The following portions of the patient's history were reviewed and updated as appropriate: allergies, current medications, past family history, past medical history, past social history, past surgical history and problem list. Problem list updated.  Objective:   Vitals:   06/28/16 1316  BP: 123/78  Pulse: 91  Weight: 283 lb (128.4 kg)    Fetal Status: Fetal Heart Rate (bpm): 145 Fundal Height: 32 cm Movement: Present  Presentation: Vertex  General:  Alert, oriented and cooperative. Patient is in no acute distress.  Skin: Skin is warm and dry. No rash noted.   Cardiovascular: Normal heart rate noted  Respiratory: Normal respiratory effort, no problems with respiration noted  Abdomen: Soft, gravid, appropriate for gestational age. Pain/Pressure: Present     Pelvic:  Cervical exam performed Dilation: Closed Effacement (%): 0 Station: -3  Extremities: Normal range of motion.  Edema: None  Mental Status: Normal mood and affect. Normal behavior. Normal judgment and thought content.   Assessment and Plan:  Pregnancy: [id number] at 2863w6d  1. Encounter for supervision of other normal pregnancy in third trimester   2. Back pain affecting pregnancy in third trimester - Comfort measures - Refer to Dr. Adrian BlackwaterStinson  - cyclobenzaprine (FLEXERIL) 10 MG tablet; Take 1 tablet (10 mg total) by mouth 3 times/day as needed-between meals & bedtime for muscle spasms.  Dispense: 30 tablet; Refill: 1 - Cervicovaginal  ancillary only - Fetal fibronectin - UA  3. Preterm contractions  - cyclobenzaprine (FLEXERIL) 10 MG tablet; Take 1 tablet (10 mg total) by mouth 3 times/day as needed-between meals & bedtime for muscle spasms.  Dispense: 30 tablet; Refill: 1 - Cervicovaginal ancillary only - Fetal fibronectin  Preterm labor symptoms and general obstetric precautions including but not limited to vaginal bleeding, contractions, leaking of fluid and fetal movement were reviewed in detail with the patient. Please refer to After Visit Summary for other counseling recommendations.  Return in about 2 weeks (around 07/12/2016) for ROB.   Dorathy KinsmanVirginia Daegan Arizmendi, CNM

## 2016-06-28 NOTE — Patient Instructions (Signed)
Sciatica Sciatica is pain, numbness, weakness, or tingling along the path of the sciatic nerve. The sciatic nerve starts in the lower back and runs down the back of each leg. The nerve controls the muscles in the lower leg and in the back of the knee. It also provides feeling (sensation) to the back of the thigh, the lower leg, and the sole of the foot. Sciatica is a symptom of another medical condition that pinches or puts pressure on the sciatic nerve. Generally, sciatica only affects one side of the body. Sciatica usually goes away on its own or with treatment. In some cases, sciatica may keep coming back (recur). What are the causes? This condition is caused by pressure on the sciatic nerve, or pinching of the sciatic nerve. This may be the result of:  A disk in between the bones of the spine (vertebrae) bulging out too far (herniated disk).  Age-related changes in the spinal disks (degenerative disk disease).  A pain disorder that affects a muscle in the buttock (piriformis syndrome).  Extra bone growth (bone spur) near the sciatic nerve.  An injury or break (fracture) of the pelvis.  Pregnancy.  Tumor (rare). What increases the risk? The following factors may make you more likely to develop this condition:  Playing sports that place pressure or stress on the spine, such as football or weight lifting.  Having poor strength and flexibility.  A history of back injury.  A history of back surgery.  Sitting for long periods of time.  Doing activities that involve repetitive bending or lifting.  Obesity. What are the signs or symptoms? Symptoms can vary from mild to very severe, and they may include:  Any of these problems in the lower back, leg, hip, or buttock:  Mild tingling or dull aches.  Burning sensations.  Sharp pains.  Numbness in the back of the calf or the sole of the foot.  Leg weakness.  Severe back pain that makes movement difficult. These symptoms may  get worse when you cough, sneeze, or laugh, or when you sit or stand for long periods of time. Being overweight may also make symptoms worse. In some cases, symptoms may recur over time. How is this diagnosed? This condition may be diagnosed based on:  Your symptoms.  A physical exam. Your health care provider may ask you to do certain movements to check whether those movements trigger your symptoms.  You may have tests, including:  Blood tests.  X-rays.  MRI.  CT scan. How is this treated? In many cases, this condition improves on its own, without any treatment. However, treatment may include:  Reducing or modifying physical activity during periods of pain.  Exercising and stretching to strengthen your abdomen and improve the flexibility of your spine.  Icing and applying heat to the affected area.  Medicines that help:  To relieve pain and swelling.  To relax your muscles.  Injections of medicines that help to relieve pain, irritation, and inflammation around the sciatic nerve (steroids).  Surgery. Follow these instructions at home: Medicines   Take over-the-counter and prescription medicines only as told by your health care provider.  Do not drive or operate heavy machinery while taking prescription pain medicine. Managing pain   If directed, apply ice to the affected area.  Put ice in a plastic bag.  Place a towel between your skin and the bag.  Leave the ice on for 20 minutes, 2-3 times a day.  After icing, apply heat to the   affected area before you exercise or as often as told by your health care provider. Use the heat source that your health care provider recommends, such as a moist heat pack or a heating pad.  Place a towel between your skin and the heat source.  Leave the heat on for 20-30 minutes.  Remove the heat if your skin turns bright red. This is especially important if you are unable to feel pain, heat, or cold. You may have a greater risk of  getting burned. Activity   Return to your normal activities as told by your health care provider. Ask your health care provider what activities are safe for you.  Avoid activities that make your symptoms worse.  Take brief periods of rest throughout the day. Resting in a lying or standing position is usually better than sitting to rest.  When you rest for longer periods, mix in some mild activity or stretching between periods of rest. This will help to prevent stiffness and pain.  Avoid sitting for long periods of time without moving. Get up and move around at least one time each hour.  Exercise and stretch regularly, as told by your health care provider.  Do not lift anything that is heavier than 10 lb (4.5 kg) while you have symptoms of sciatica. When you do not have symptoms, you should still avoid heavy lifting, especially repetitive heavy lifting.  When you lift objects, always use proper lifting technique, which includes:  Bending your knees.  Keeping the load close to your body.  Avoiding twisting. General instructions   Use good posture.  Avoid leaning forward while sitting.  Avoid hunching over while standing.  Maintain a healthy weight. Excess weight puts extra stress on your back and makes it difficult to maintain good posture.  Wear supportive, comfortable shoes. Avoid wearing high heels.  Avoid sleeping on a mattress that is too soft or too hard. A mattress that is firm enough to support your back when you sleep may help to reduce your pain.  Keep all follow-up visits as told by your health care provider. This is important. Contact a health care provider if:  You have pain that wakes you up when you are sleeping.  You have pain that gets worse when you lie down.  Your pain is worse than you have experienced in the past.  Your pain lasts longer than 4 weeks.  You experience unexplained weight loss. Get help right away if:  You lose control of your bowel  or bladder (incontinence).  You have:  Weakness in your lower back, pelvis, buttocks, or legs that gets worse.  Redness or swelling of your back.  A burning sensation when you urinate. This information is not intended to replace advice given to you by your health care provider. Make sure you discuss any questions you have with your health care provider. Document Released: 04/10/2001 Document Revised: 09/20/2015 Document Reviewed: 12/24/2014 Elsevier Interactive Patient Education  2017 [employer] Inc.  

## 2016-06-29 ENCOUNTER — Ambulatory Visit (INDEPENDENT_AMBULATORY_CARE_PROVIDER_SITE_OTHER): Payer: Medicaid Other | Admitting: Family Medicine

## 2016-06-29 VITALS — BP 120/66 | HR 88 | Wt 282.0 lb

## 2016-06-29 DIAGNOSIS — M9903 Segmental and somatic dysfunction of lumbar region: Secondary | ICD-10-CM

## 2016-06-29 DIAGNOSIS — M9904 Segmental and somatic dysfunction of sacral region: Secondary | ICD-10-CM

## 2016-06-29 DIAGNOSIS — M9905 Segmental and somatic dysfunction of pelvic region: Secondary | ICD-10-CM

## 2016-06-29 DIAGNOSIS — M549 Dorsalgia, unspecified: Secondary | ICD-10-CM

## 2016-06-29 DIAGNOSIS — Z3483 Encounter for supervision of other normal pregnancy, third trimester: Secondary | ICD-10-CM

## 2016-06-29 DIAGNOSIS — M9902 Segmental and somatic dysfunction of thoracic region: Secondary | ICD-10-CM

## 2016-06-29 DIAGNOSIS — M9908 Segmental and somatic dysfunction of rib cage: Secondary | ICD-10-CM

## 2016-06-29 DIAGNOSIS — O9989 Other specified diseases and conditions complicating pregnancy, childbirth and the puerperium: Secondary | ICD-10-CM

## 2016-06-29 DIAGNOSIS — O99891 Other specified diseases and conditions complicating pregnancy: Secondary | ICD-10-CM

## 2016-06-29 DIAGNOSIS — O26893 Other specified pregnancy related conditions, third trimester: Secondary | ICD-10-CM

## 2016-06-29 LAB — CERVICOVAGINAL ANCILLARY ONLY
Bacterial vaginitis: NEGATIVE
CANDIDA VAGINITIS: NEGATIVE
CHLAMYDIA, DNA PROBE: NEGATIVE
Neisseria Gonorrhea: NEGATIVE
Trichomonas: NEGATIVE

## 2016-06-29 LAB — FETAL FIBRONECTIN: Fetal Fibronectin: NEGATIVE

## 2016-06-29 NOTE — Progress Notes (Signed)
   PRENATAL VISIT NOTE  Subjective:  Kendra LevinsJasmine F [name] is a 26 y.o. [phone] at 574w0d being seen today for ongoing prenatal care.  She is currently monitored for the following issues for this low-risk pregnancy and has Supervision of normal pregnancy in third trimester on her problem list.  Patient reports right sided lower back pain with radiation into right leg. No weakness. Worse with transition from sitting to standing. .  Contractions: Not present. Vag. Bleeding: None.  Movement: Present. Denies leaking of fluid.   The following portions of the patient's history were reviewed and updated as appropriate: allergies, current medications, past family history, past medical history, past social history, past surgical history and problem list. Problem list updated.  Objective:   Vitals:   06/29/16 1044  BP: 120/66  Pulse: 88  Weight: 282 lb (127.9 kg)    Fetal Status:     Movement: Present     General:  Alert, oriented and cooperative. Patient is in no acute distress.  Skin: Skin is warm and dry. No rash noted.   Cardiovascular: Normal heart rate noted  Respiratory: Normal respiratory effort, no problems with respiration noted  Abdomen: Soft, gravid, appropriate for gestational age. Pain/Pressure: Present     Pelvic:  Cervical exam deferred        MSK: Restriction, tenderness, tissue texture changes, and paraspinal spasm in the lumbar and thoracic spine  Neuro: Moves all four extremities with no focal neurological deficit  Extremities: Normal range of motion.  Edema: None  Mental Status: Normal mood and affect. Normal behavior. Normal judgment and thought content.   OSE: Head   Cervical   Thoracic T7-8 FSRR  Rib 7-8 inhaled  Lumbar L3 ESRR, L5 ESRL  Sacrum L/L  Pelvis Ant right    Assessment and Plan:  Pregnancy: G5P3013 at [redacted]w[redacted]d  1. Encounter for supervision of other normal pregnancy in third trimester FHT normal.  2. Back pain affecting pregnancy in third trimester 3.  Somatic dysfunction of pelvis region 4. Somatic dysfunction of sacral region 5. Somatic dysfunction of lumbar region 6. Somatic dysfunction of thoracic region 7. Somatic dysfunction of rib cage region OMT done after patient permission. HVLA technique utilized. Patient tolerated procedure well. Continue flexeril - 1/2 tab during day, if doesn't make her somnolent. 1 tab at night. Continue heating pad - 20 min on, then at least 20 min off.     Preterm labor symptoms and general obstetric precautions including but not limited to vaginal bleeding, contractions, leaking of fluid and fetal movement were reviewed in detail with the patient. Please refer to After Visit Summary for other counseling recommendations.  Return in about 3 weeks (around 07/20/2016) for f/u back pain in pregnancy.  Kendra HeritageJacob J Stinson, DO

## 2016-07-03 ENCOUNTER — Telehealth: Payer: Self-pay | Admitting: *Deleted

## 2016-07-03 NOTE — Telephone Encounter (Signed)
Called pt, no answer, left message in regards to normal results.

## 2016-07-03 NOTE — Telephone Encounter (Signed)
-----   [place] sent at 06/29/2016  3:18 PM EST ----- Please inform pt of neg fFN-->very low risk for preterm delivery.

## 2016-07-06 ENCOUNTER — Ambulatory Visit (INDEPENDENT_AMBULATORY_CARE_PROVIDER_SITE_OTHER): Payer: Medicaid Other | Admitting: Obstetrics & Gynecology

## 2016-07-06 DIAGNOSIS — Z3483 Encounter for supervision of other normal pregnancy, third trimester: Secondary | ICD-10-CM

## 2016-07-06 NOTE — Patient Instructions (Signed)
Return to clinic for any scheduled appointments or obstetric concerns, or go to [facility] for evaluation  

## 2016-07-06 NOTE — Progress Notes (Signed)
   PRENATAL VISIT NOTE  Subjective:  Kendra LevinsJasmine F [name] is a 26 y.o. [phone] at [redacted]w[redacted]d being seen today for ongoing prenatal care.  She is currently monitored for the following issues for this low-risk pregnancy and has Supervision of normal pregnancy in third trimester on her problem list.  Patient reports no complaints.  Contractions: Not present. Vag. Bleeding: None.  Movement: Present. Denies leaking of fluid.   The following portions of the patient's history were reviewed and updated as appropriate: allergies, current medications, past family history, past medical history, past social history, past surgical history and problem list. Problem list updated.  Objective:   Vitals:   07/06/16 1045  BP: 124/87  Pulse: 97  Weight: 289 lb (131.1 kg)    Fetal Status: Fetal Heart Rate (bpm): 153 Fundal Height: 38 cm Movement: Present     General:  Alert, oriented and cooperative. Patient is in no acute distress.  Skin: Skin is warm and dry. No rash noted.   Cardiovascular: Normal heart rate noted  Respiratory: Normal respiratory effort, no problems with respiration noted  Abdomen: Soft, gravid, appropriate for gestational age. Pain/Pressure: Present     Pelvic:  Cervical exam deferred        Extremities: Normal range of motion.  Edema: Trace  Mental Status: Normal mood and affect. Normal behavior. Normal judgment and thought content.   Assessment and Plan:  Pregnancy: [id number] at [redacted]w[redacted]d  1. Encounter for supervision of other normal pregnancy in third trimester Preterm labor symptoms and general obstetric precautions including but not limited to vaginal bleeding, contractions, leaking of fluid and fetal movement were reviewed in detail with the patient. Please refer to After Visit Summary for other counseling recommendations.  Return in about 2 weeks (around 07/20/2016) for OB Visit, Pelvic cultures.   Tereso NewcomerUgonna A Angelino Rumery, MD

## 2016-07-10 ENCOUNTER — Encounter: Payer: Medicaid Other | Admitting: Family Medicine

## 2016-07-13 ENCOUNTER — Encounter: Payer: Medicaid Other | Admitting: Family Medicine

## 2016-07-24 ENCOUNTER — Ambulatory Visit (INDEPENDENT_AMBULATORY_CARE_PROVIDER_SITE_OTHER): Payer: Medicaid Other | Admitting: Obstetrics & Gynecology

## 2016-07-24 VITALS — BP 124/84 | HR 108 | Wt 289.0 lb

## 2016-07-24 DIAGNOSIS — Z3483 Encounter for supervision of other normal pregnancy, third trimester: Secondary | ICD-10-CM

## 2016-07-24 LAB — OB RESULTS CONSOLE GBS: GBS: POSITIVE

## 2016-07-24 NOTE — Progress Notes (Signed)
   PRENATAL VISIT NOTE  Subjective:  Kendra [name] is a 26 y.o. [phone] at 5561w4d being seen today for ongoing prenatal care.  She is currently monitored for the following issues for this low-risk pregnancy and has Supervision of normal pregnancy in third trimester on her problem list.  Patient reports no complaints.  Contractions: Not present. Vag. Bleeding: None.  Movement: Present. Denies leaking of fluid.   The following portions of the patient's history were reviewed and updated as appropriate: allergies, current medications, past family history, past medical history, past social history, past surgical history and problem list. Problem list updated.  Objective:   Vitals:   07/24/16 0942  BP: 124/84  Pulse: (!) 108  Weight: 289 lb (131.1 kg)    Fetal Status: Fetal Heart Rate (bpm): 158   Movement: Present     General:  Alert, oriented and cooperative. Patient is in no acute distress.  Skin: Skin is warm and dry. No rash noted.   Cardiovascular: Normal heart rate noted  Respiratory: Normal respiratory effort, no problems with respiration noted  Abdomen: Soft, gravid, appropriate for gestational age. Pain/Pressure: Present     Pelvic:  Cervical exam deferred        Extremities: Normal range of motion.  Edema: Trace  Mental Status: Normal mood and affect. Normal behavior. Normal judgment and thought content.   Assessment and Plan:  Pregnancy: [id number] at 2961w4d  1. Encounter for supervision of other normal pregnancy in third trimester - Cervical cultures today  Preterm labor symptoms and general obstetric precautions including but not limited to vaginal bleeding, contractions, leaking of fluid and fetal movement were reviewed in detail with the patient. Please refer to After Visit Summary for other counseling recommendations.  No Follow-up on file.   Kendra BossierMyra C Kealie Barrie, MD

## 2016-07-26 LAB — GC/CHLAMYDIA PROBE AMP (~~LOC~~) NOT AT ARMC
Chlamydia: NEGATIVE
Neisseria Gonorrhea: NEGATIVE

## 2016-07-28 LAB — CULTURE, BETA STREP (GROUP B ONLY): Strep Gp B Culture: POSITIVE — AB

## 2016-08-01 ENCOUNTER — Encounter: Payer: Self-pay | Admitting: Obstetrics & Gynecology

## 2016-08-03 ENCOUNTER — Ambulatory Visit (INDEPENDENT_AMBULATORY_CARE_PROVIDER_SITE_OTHER): Payer: Medicaid Other | Admitting: Obstetrics and Gynecology

## 2016-08-03 VITALS — BP 127/84 | HR 90 | Wt 297.0 lb

## 2016-08-03 DIAGNOSIS — Z3483 Encounter for supervision of other normal pregnancy, third trimester: Secondary | ICD-10-CM

## 2016-08-03 DIAGNOSIS — O26843 Uterine size-date discrepancy, third trimester: Secondary | ICD-10-CM

## 2016-08-03 NOTE — Progress Notes (Signed)
Pt presents for ROB voiced concerns about 8lb weight gain in one week.

## 2016-08-03 NOTE — Progress Notes (Signed)
Prenatal Visit Note Date: 08/03/2016 Clinic: [facility]  Subjective:  Kendra [name] is a 26 y.o. [phone] at [redacted]w[redacted]d being seen today for ongoing prenatal care.  She is currently monitored for the following issues for this low-risk pregnancy and has GBS (group B Streptococcus carrier), +RV culture, currently pregnant and Supervision of normal pregnancy in third trimester on her problem list.  Patient reports weight gain. she states about average with her other pregnancies. .   Contractions: Irregular. Vag. Bleeding: None.  Movement: Present. Denies leaking of fluid.   The following portions of the patient's history were reviewed and updated as appropriate: allergies, current medications, past family history, past medical history, past social history, past surgical history and problem list. Problem list updated.  Objective:   Vitals:   08/03/16 0847  BP: 127/84  Pulse: 90  Weight: 297 lb (134.7 kg)    Fetal Status: Fetal Heart Rate (bpm): 138   Movement: Present     General:  Alert, oriented and cooperative. Patient is in no acute distress.  Skin: Skin is warm and dry. No rash noted.   Cardiovascular: Normal heart rate noted  Respiratory: Normal respiratory effort, no problems with respiration noted  Abdomen: Soft, gravid, appropriate for gestational age. Pain/Pressure: Present     Pelvic:  Cervical exam deferred        Extremities: Normal range of motion.  Edema: Trace  Mental Status: Normal mood and affect. Normal behavior. Normal judgment and thought content.   Urinalysis:      Assessment and Plan:  Pregnancy: [id number] at [redacted]w[redacted]d  1. Encounter for supervision of other normal pregnancy in third trimester Routine care.   2. Size of fetus inconsistent with dates in third trimester Pt unsure if feels same as prior size (5-6lbs for other pregnancies); pt amenable to growth for s>d - [place] MFM OB FOLLOW UP; Future  3. GBS pos tx in labor Term labor symptoms  and general obstetric precautions including but not limited to vaginal bleeding, contractions, leaking of fluid and fetal movement were reviewed in detail with the patient. Please refer to After Visit Summary for other counseling recommendations.  Return in about 1 week (around 08/10/2016).   Baker Bing, MD

## 2016-08-08 ENCOUNTER — Ambulatory Visit (HOSPITAL_COMMUNITY)
Admission: RE | Admit: 2016-08-08 | Discharge: 2016-08-08 | Disposition: A | Discharge status: Home / Self Care | Payer: Medicaid Other | Source: Ambulatory Visit | Attending: Obstetrics and Gynecology | Admitting: Obstetrics and Gynecology

## 2016-08-08 DIAGNOSIS — Z3A37 37 weeks gestation of pregnancy: Secondary | ICD-10-CM | POA: Insufficient documentation

## 2016-08-08 DIAGNOSIS — Z362 Encounter for other antenatal screening follow-up: Secondary | ICD-10-CM | POA: Diagnosis not present

## 2016-08-08 DIAGNOSIS — O10013 Pre-existing essential hypertension complicating pregnancy, third trimester: Secondary | ICD-10-CM | POA: Diagnosis not present

## 2016-08-08 DIAGNOSIS — O99213 Obesity complicating pregnancy, third trimester: Secondary | ICD-10-CM | POA: Diagnosis not present

## 2016-08-08 DIAGNOSIS — O26843 Uterine size-date discrepancy, third trimester: Secondary | ICD-10-CM | POA: Diagnosis present

## 2016-08-10 ENCOUNTER — Other Ambulatory Visit: Payer: Self-pay | Admitting: Obstetrics and Gynecology

## 2016-08-10 ENCOUNTER — Encounter: Payer: Self-pay | Admitting: Obstetrics and Gynecology

## 2016-08-10 ENCOUNTER — Telehealth (HOSPITAL_COMMUNITY): Payer: Self-pay | Admitting: *Deleted

## 2016-08-10 ENCOUNTER — Ambulatory Visit (INDEPENDENT_AMBULATORY_CARE_PROVIDER_SITE_OTHER): Payer: Medicaid Other | Admitting: Obstetrics and Gynecology

## 2016-08-10 ENCOUNTER — Encounter (HOSPITAL_COMMUNITY): Payer: Self-pay | Admitting: *Deleted

## 2016-08-10 VITALS — BP 135/77 | HR 90 | Wt 297.0 lb

## 2016-08-10 DIAGNOSIS — O10913 Unspecified pre-existing hypertension complicating pregnancy, third trimester: Secondary | ICD-10-CM | POA: Diagnosis not present

## 2016-08-10 DIAGNOSIS — O0930 Supervision of pregnancy with insufficient antenatal care, unspecified trimester: Secondary | ICD-10-CM | POA: Insufficient documentation

## 2016-08-10 DIAGNOSIS — O0933 Supervision of pregnancy with insufficient antenatal care, third trimester: Secondary | ICD-10-CM | POA: Diagnosis not present

## 2016-08-10 DIAGNOSIS — O99820 Streptococcus B carrier state complicating pregnancy: Secondary | ICD-10-CM | POA: Diagnosis not present

## 2016-08-10 DIAGNOSIS — O0990 Supervision of high risk pregnancy, unspecified, unspecified trimester: Secondary | ICD-10-CM

## 2016-08-10 DIAGNOSIS — O10919 Unspecified pre-existing hypertension complicating pregnancy, unspecified trimester: Secondary | ICD-10-CM

## 2016-08-10 NOTE — Telephone Encounter (Signed)
Preadmission screen  

## 2016-08-10 NOTE — Progress Notes (Signed)
Prenatal Visit Note Date: 08/10/2016 Clinic: [facility]  Subjective:  Kendra [name] is a 26 y.o. [phone] at [redacted]w[redacted]d being seen today for ongoing prenatal care.  She is currently monitored for the following issues for this high-risk pregnancy and has GBS (group B Streptococcus carrier), +RV culture, currently pregnant; Supervision of normal pregnancy in third trimester; Late prenatal care; and Chronic hypertension during pregnancy, antepartum on her problem list.  Patient reports no complaints.   Contractions: Irregular. Vag. Bleeding: None.  Movement: Present. Denies leaking of fluid.   The following portions of the patient's history were reviewed and updated as appropriate: allergies, current medications, past family history, past medical history, past social history, past surgical history and problem list. Problem list updated.  Objective:   Vitals:   08/10/16 0904  BP: 135/77  Pulse: 90  Weight: 297 lb (134.7 kg)    Fetal Status: Fetal Heart Rate (bpm): 141 Fundal Height: 40 cm Movement: Present  Presentation: Vertex  General:  Alert, oriented and cooperative. Patient is in no acute distress.  Skin: Skin is warm and dry. No rash noted.   Cardiovascular: Normal heart rate noted  Respiratory: Normal respiratory effort, no problems with respiration noted  Abdomen: Soft, gravid, appropriate for gestational age. Pain/Pressure: Present     Pelvic:  Cervical exam deferred        Extremities: Normal range of motion.  Edema: Trace  Mental Status: Normal mood and affect. Normal behavior. Normal judgment and thought content.   Urinalysis:      Assessment and Plan:  Pregnancy: [id number] at [redacted]w[redacted]d  1. Encounter for supervision of high risk pregnancy Routine care.  2. GBS (group B Streptococcus carrier), +RV culture, currently pregnant tx in labor  3. Late prenatal care No change in [facility]  4. Chronic hypertension during pregnancy, antepartum On growth u/s, done  for s>d, it notes cHTN in her history. This wasn't on her PL but looking at her hx tab, she does have HTN noted and her flowsheet shows HTN outside of pregnancy. In talking to the patient, she states in the remote past she was on meds for HTN but hasn't needed anything in years. 4/11 u/s with normal afi and efw 86% @ 3484gm, ac @ 92%, cephalic. Pt able to stay for NST today, which was reactive. Will set up IOL for 38/6 to 39/0 and bring patient back in 3-4d for rpt NST/AFI/ROB and check cx. Pt amenable to plan.    Preterm labor symptoms and general obstetric precautions including but not limited to vaginal bleeding, contractions, leaking of fluid and fetal movement were reviewed in detail with the patient. Please refer to After Visit Summary for other counseling recommendations.  Return in about 3 days (around 08/13/2016) for 3-4d for rpt NST, an AFI, and ROB.   [place] Bing, MD

## 2016-08-13 ENCOUNTER — Ambulatory Visit (INDEPENDENT_AMBULATORY_CARE_PROVIDER_SITE_OTHER): Payer: Medicaid Other | Admitting: Obstetrics & Gynecology

## 2016-08-13 VITALS — BP 122/68 | HR 88 | Wt 300.0 lb

## 2016-08-13 DIAGNOSIS — O10913 Unspecified pre-existing hypertension complicating pregnancy, third trimester: Secondary | ICD-10-CM

## 2016-08-13 DIAGNOSIS — O0990 Supervision of high risk pregnancy, unspecified, unspecified trimester: Secondary | ICD-10-CM

## 2016-08-13 DIAGNOSIS — O0993 Supervision of high risk pregnancy, unspecified, third trimester: Secondary | ICD-10-CM

## 2016-08-13 NOTE — Patient Instructions (Signed)
Return to clinic for any scheduled appointments or obstetric concerns, or go to [facility] for evaluation   Labor Induction Labor induction is when steps are taken to cause a pregnant woman to begin the labor process. Most women go into labor on their own between 37 weeks and 42 weeks of the pregnancy. When this does not happen or when there is a medical need, methods may be used to induce labor. Labor induction causes a pregnant woman's uterus to contract. It also causes the cervix to soften (ripen), open (dilate), and thin out (efface). Usually, labor is not induced before 39 weeks of the pregnancy unless there is a problem with the baby or mother. Before inducing labor, your health care provider will consider a number of factors, including the following:  The medical condition of you and the baby.  How many weeks along you are.  The status of the baby's lung maturity.  The condition of the cervix.  The position of the baby.  What are the reasons for labor induction? Labor may be induced for the following reasons:  The health of the baby or mother is at risk.  The pregnancy is overdue by 1 week or more.  The water breaks but labor does not start on its own.  The mother has a health condition or serious illness, such as high blood pressure, infection, placental abruption, or diabetes.  The amniotic fluid amounts are low around the baby.  The baby is distressed.  Convenience or wanting the baby to be born on a certain date is not a reason for inducing labor. What methods are used for labor induction? Several methods of labor induction may be used, such as:  Prostaglandin medicine. This medicine causes the cervix to dilate and ripen. The medicine will also start contractions. It can be taken by mouth or by inserting a suppository into the vagina.  Inserting a thin tube (catheter) with a balloon on the end into the vagina to dilate the cervix. Once inserted, the balloon is expanded  with water, which causes the cervix to open.  Stripping the membranes. Your health care provider separates amniotic sac tissue from the cervix, causing the cervix to be stretched and causing the release of a hormone called progesterone. This may cause the uterus to contract. It is often done during an office visit. You will be sent home to wait for the contractions to begin. You will then come in for an induction.  Breaking the water. Your health care provider makes a hole in the amniotic sac using a small instrument. Once the amniotic sac breaks, contractions should begin. This may still take hours to see an effect.  Medicine to trigger or strengthen contractions. This medicine is given through an IV access tube inserted into a vein in your arm.  All of the methods of induction, besides stripping the membranes, will be done in the hospital. Induction is done in the hospital so that you and the baby can be carefully monitored. How long does it take for labor to be induced? Some inductions can take up to 2-3 days. Depending on the cervix, it usually takes less time. It takes longer when you are induced early in the pregnancy or if this is your first pregnancy. If a mother is still pregnant and the induction has been going on for 2-3 days, either the mother will be sent home or a cesarean delivery will be needed. What are the risks associated with labor induction? Some of the risks   of induction include:  Changes in fetal heart rate, such as too high, too low, or erratic.  Fetal distress.  Chance of infection for the mother and baby.  Increased chance of having a cesarean delivery.  Breaking off (abruption) of the placenta from the uterus (rare).  Uterine rupture (very rare).  When induction is needed for medical reasons, the benefits of induction may outweigh the risks. What are some reasons for not inducing labor? Labor induction should not be done if:  It is shown that your baby does  not tolerate labor.  You have had previous surgeries on your uterus, such as a myomectomy or the removal of fibroids.  Your placenta lies very low in the uterus and blocks the opening of the cervix (placenta previa).  Your baby is not in a head-down position.  The umbilical cord drops down into the birth canal in front of the baby. This could cut off the baby's blood and oxygen supply.  You have had a previous cesarean delivery.  There are unusual circumstances, such as the baby being extremely premature.  This information is not intended to replace advice given to you by your health care provider. Make sure you discuss any questions you have with your health care provider. Document Released: 09/05/2006 Document Revised: 09/22/2015 Document Reviewed: 11/13/2012 Elsevier Interactive Patient Education  2017 [employer]  

## 2016-08-13 NOTE — Progress Notes (Signed)
   PRENATAL VISIT NOTE  Subjective:  Kendra [name] is a 26 y.o. [phone] at [redacted]w[redacted]d being seen today for ongoing prenatal care.  She is currently monitored for the following issues for this high-risk pregnancy and has GBS (group B Streptococcus carrier), +RV culture, currently pregnant; Supervision of high risk pregnancy, antepartum; Late prenatal care; and Chronic hypertension during pregnancy, antepartum on her problem list.  Patient reports occasional contractions.  Contractions: Irregular. Vag. Bleeding: None.  Movement: Present. Denies leaking of fluid.   The following portions of the patient's history were reviewed and updated as appropriate: allergies, current medications, past family history, past medical history, past social history, past surgical history and problem list. Problem list updated.  Objective:   Vitals:   08/13/16 1448  BP: 122/68  Pulse: 88  Weight: 300 lb (136.1 kg)    Fetal Status: Fetal Heart Rate (bpm): 137 Fundal Height: 41 cm Movement: Present  Presentation: Vertex  General:  Alert, oriented and cooperative. Patient is in no acute distress.  Skin: Skin is warm and dry. No rash noted.   Cardiovascular: Normal heart rate noted  Respiratory: Normal respiratory effort, no problems with respiration noted  Abdomen: Soft, gravid, appropriate for gestational age. Pain/Pressure: Present     Pelvic:  Cervical exam performed Dilation: 2 Effacement (%): Thick Station: -3  Extremities: Normal range of motion.  Edema: Trace  Mental Status: Normal mood and affect. Normal behavior. Normal judgment and thought content.   Assessment and Plan:  Pregnancy: [id number] at [redacted]w[redacted]d  1. Maternal chronic hypertension in third trimester NST performed today was reviewed and was found to be reactive.  AFI was also normal.  Continue recommended antenatal testing and prenatal care. Stable BP, no medications.  IOL scheduled at 39 weeks.  2. Supervision of high risk pregnancy,  antepartum Term labor symptoms and general obstetric precautions including but not limited to vaginal bleeding, contractions, leaking of fluid and fetal movement were reviewed in detail with the patient. Please refer to After Visit Summary for other counseling recommendations.  Return in about 6 weeks (around 09/24/2016) for Postpartum check.   Tereso Newcomer, MD

## 2016-08-15 ENCOUNTER — Other Ambulatory Visit: Payer: Self-pay | Admitting: Advanced Practice Midwife

## 2016-08-17 ENCOUNTER — Inpatient Hospital Stay (HOSPITAL_COMMUNITY): Payer: Medicaid Other | Admitting: Anesthesiology

## 2016-08-17 ENCOUNTER — Inpatient Hospital Stay (HOSPITAL_COMMUNITY)
Admission: RE | Admit: 2016-08-17 | Discharge: 2016-08-19 | DRG: 774 | Disposition: A | Discharge status: Home / Self Care | Payer: Medicaid Other | Source: Ambulatory Visit | Attending: Family Medicine | Admitting: Family Medicine

## 2016-08-17 ENCOUNTER — Encounter: Payer: Medicaid Other | Admitting: Obstetrics & Gynecology

## 2016-08-17 ENCOUNTER — Encounter (HOSPITAL_COMMUNITY): Payer: Self-pay

## 2016-08-17 DIAGNOSIS — O10919 Unspecified pre-existing hypertension complicating pregnancy, unspecified trimester: Secondary | ICD-10-CM | POA: Diagnosis present

## 2016-08-17 DIAGNOSIS — Z6841 Body Mass Index (BMI) 40.0 and over, adult: Secondary | ICD-10-CM

## 2016-08-17 DIAGNOSIS — O1002 Pre-existing essential hypertension complicating childbirth: Principal | ICD-10-CM | POA: Diagnosis present

## 2016-08-17 DIAGNOSIS — O99214 Obesity complicating childbirth: Secondary | ICD-10-CM | POA: Diagnosis present

## 2016-08-17 DIAGNOSIS — O99824 Streptococcus B carrier state complicating childbirth: Secondary | ICD-10-CM | POA: Diagnosis present

## 2016-08-17 DIAGNOSIS — Z3A39 39 weeks gestation of pregnancy: Secondary | ICD-10-CM

## 2016-08-17 DIAGNOSIS — O1092 Unspecified pre-existing hypertension complicating childbirth: Secondary | ICD-10-CM | POA: Diagnosis not present

## 2016-08-17 LAB — CBC
HCT: 45 % (ref 36.0–46.0)
HEMOGLOBIN: 15.3 g/dL — AB (ref 12.0–15.0)
MCH: 30.5 pg (ref 26.0–34.0)
MCHC: 34 g/dL (ref 30.0–36.0)
MCV: 89.8 fL (ref 78.0–100.0)
PLATELETS: 287 10*3/uL (ref 150–400)
RBC: 5.01 MIL/uL (ref 3.87–5.11)
RDW: 13.8 % (ref 11.5–15.5)
WBC: 7.8 10*3/uL (ref 4.0–10.5)

## 2016-08-17 LAB — TYPE AND SCREEN
ABO/RH(D): O POS
ANTIBODY SCREEN: NEGATIVE

## 2016-08-17 MED ORDER — ACETAMINOPHEN 325 MG PO TABS: 650.0000 mg | ORAL_TABLET | ORAL | Status: DC | PRN

## 2016-08-17 MED ORDER — LIDOCAINE HCL (PF) 1 % IJ SOLN
30.0000 mL | INTRAMUSCULAR | Status: DC | PRN
  Filled 2016-08-17: qty 30

## 2016-08-17 MED ORDER — OXYTOCIN BOLUS FROM INFUSION
500.0000 mL | Freq: Once | INTRAVENOUS | Status: AC
  Administered 2016-08-18: 500 mL via INTRAVENOUS

## 2016-08-17 MED ORDER — EPHEDRINE 5 MG/ML INJ
10.0000 mg | INTRAVENOUS | Status: DC | PRN
  Filled 2016-08-17: qty 2

## 2016-08-17 MED ORDER — PENICILLIN G POTASSIUM 5000000 UNITS IJ SOLR
5.0000 10*6.[IU] | Freq: Once | INTRAVENOUS | Status: AC
  Administered 2016-08-17: 5 10*6.[IU] via INTRAVENOUS
  Filled 2016-08-17: qty 5

## 2016-08-17 MED ORDER — PENICILLIN G POT IN DEXTROSE 60000 UNIT/ML IV SOLN
3.0000 10*6.[IU] | INTRAVENOUS | Status: DC
  Administered 2016-08-17 – 2016-08-18 (×5): 3 10*6.[IU] via INTRAVENOUS
  Filled 2016-08-17 (×7): qty 50

## 2016-08-17 MED ORDER — TERBUTALINE SULFATE 1 MG/ML IJ SOLN
0.2500 mg | Freq: Once | INTRAMUSCULAR | Status: DC | PRN
  Filled 2016-08-17: qty 1

## 2016-08-17 MED ORDER — SOD CITRATE-CITRIC ACID 500-334 MG/5ML PO SOLN: 30.0000 mL | ORAL | Status: DC | PRN

## 2016-08-17 MED ORDER — MISOPROSTOL 25 MCG QUARTER TABLET
25.0000 ug | ORAL_TABLET | ORAL | Status: DC | PRN
  Administered 2016-08-17 (×2): 25 ug via VAGINAL
  Filled 2016-08-17 (×3): qty 1

## 2016-08-17 MED ORDER — OXYTOCIN 40 UNITS IN LACTATED RINGERS INFUSION - SIMPLE MED
2.5000 [IU]/h | INTRAVENOUS | Status: DC
  Filled 2016-08-17: qty 1000

## 2016-08-17 MED ORDER — OXYTOCIN 40 UNITS IN LACTATED RINGERS INFUSION - SIMPLE MED
1.0000 m[IU]/min | INTRAVENOUS | Status: DC
  Administered 2016-08-17: 2 m[IU]/min via INTRAVENOUS

## 2016-08-17 MED ORDER — LIDOCAINE HCL (PF) 1 % IJ SOLN
INTRAMUSCULAR | Status: DC | PRN
Start: 2016-08-17 — End: 2016-08-18
  Administered 2016-08-17: 6 mL via EPIDURAL
  Administered 2016-08-17: 4 mL

## 2016-08-17 MED ORDER — DIPHENHYDRAMINE HCL 50 MG/ML IJ SOLN: 12.5000 mg | INTRAMUSCULAR | Status: DC | PRN

## 2016-08-17 MED ORDER — LACTATED RINGERS IV SOLN: 500.0000 mL | Freq: Once | INTRAVENOUS | Status: DC

## 2016-08-17 MED ORDER — LACTATED RINGERS IV SOLN: 500.0000 mL | INTRAVENOUS | Status: DC | PRN

## 2016-08-17 MED ORDER — FENTANYL 2.5 MCG/ML BUPIVACAINE 1/10 % EPIDURAL INFUSION (WH - ANES)
14.0000 mL/h | INTRAMUSCULAR | Status: DC | PRN
  Administered 2016-08-17 – 2016-08-18 (×3): 14 mL/h via EPIDURAL
  Filled 2016-08-17 (×3): qty 100

## 2016-08-17 MED ORDER — PHENYLEPHRINE 40 MCG/ML (10ML) SYRINGE FOR IV PUSH (FOR BLOOD PRESSURE SUPPORT)
80.0000 ug | PREFILLED_SYRINGE | INTRAVENOUS | Status: DC | PRN
  Filled 2016-08-17: qty 5

## 2016-08-17 MED ORDER — ONDANSETRON HCL 4 MG/2ML IJ SOLN: 4.0000 mg | Freq: Four times a day (QID) | INTRAMUSCULAR | Status: DC | PRN

## 2016-08-17 MED ORDER — FENTANYL CITRATE (PF) 100 MCG/2ML IJ SOLN: 50.0000 ug | INTRAMUSCULAR | Status: DC | PRN

## 2016-08-17 MED ORDER — LACTATED RINGERS IV SOLN
INTRAVENOUS | Status: DC
  Administered 2016-08-17 – 2016-08-18 (×4): via INTRAVENOUS

## 2016-08-17 MED ORDER — PHENYLEPHRINE 40 MCG/ML (10ML) SYRINGE FOR IV PUSH (FOR BLOOD PRESSURE SUPPORT)
80.0000 ug | PREFILLED_SYRINGE | INTRAVENOUS | Status: DC | PRN
  Filled 2016-08-17: qty 10
  Filled 2016-08-17: qty 5

## 2016-08-17 NOTE — Progress Notes (Signed)
Patient ID: Kendra [name], female   DOB: 04-03-1991, 26 y.o.   MRN: [id number]  S: Patient seen & examined for progress of labor. Patient becoming increasingly uncomfortable with contractions.    O:  Vitals:   08/17/16 1005 08/17/16 1105 08/17/16 1200 08/17/16 1210  BP:    140/60  Pulse:    94  Resp: Temp:      TempSrc:      Weight:      Height:        Dilation: 2 Effacement (%): 70 Cervical Position: Posterior Station: -3 Presentation: Vertex Exam by:: Joni Reining Jones/Jenn Cox   FHT: 130bpm, mod var, +accels, no decels TOCO: q2-81min   A/P: Cytotec #2 just placed, contracting frequently after placement Continue expectant management Anticipate SVD

## 2016-08-17 NOTE — Anesthesia Preprocedure Evaluation (Signed)
Anesthesia Evaluation  Patient identified by MRN, date of birth, ID band Patient awake    Reviewed: Allergy & Precautions, H&P , Patient's Chart, lab work & pertinent test results  Airway Mallampati: II  TM Distance: >3 FB Neck ROM: full    Dental  (+) Teeth Intact   Pulmonary    breath sounds clear to auscultation       Cardiovascular hypertension,  Rhythm:regular Rate:Normal     Neuro/Psych    GI/Hepatic   Endo/Other  Morbid obesity  Renal/GU      Musculoskeletal   Abdominal   Peds  Hematology   Anesthesia Other Findings       Reproductive/Obstetrics (+) Pregnancy                             Anesthesia Physical Anesthesia Plan  ASA: III  Anesthesia Plan: Epidural   Post-op Pain Management:    Induction:   Airway Management Planned:   Additional Equipment:   Intra-op Plan:   Post-operative Plan:   Informed Consent: I have reviewed the patients History and Physical, chart, labs and discussed the procedure including the risks, benefits and alternatives for the proposed anesthesia with the patient or authorized representative who has indicated his/her understanding and acceptance.   Dental Advisory Given  Plan Discussed with:   Anesthesia Plan Comments: (Labs checked- platelets confirmed with RN in room. Fetal heart tracing, per RN, reported to be stable enough for sitting procedure. Discussed epidural, and patient consents to the procedure:  included risk of possible headache,backache, failed block, allergic reaction, and nerve injury. This patient was asked if she had any questions or concerns before the procedure started.)        Anesthesia Quick Evaluation  

## 2016-08-17 NOTE — Anesthesia Procedure Notes (Signed)
Epidural Patient location during procedure: OB  Staffing Anesthesiologist: Liese Dizdarevic  Preanesthetic Checklist Completed: patient identified, site marked, surgical consent, pre-op evaluation, timeout performed, IV checked, risks and benefits discussed and monitors and equipment checked  Epidural Patient position: sitting Prep: site prepped and draped and DuraPrep Patient monitoring: continuous pulse ox and blood pressure Approach: midline Location: L3-L4 Injection technique: LOR air  Needle:  Needle type: Tuohy  Needle gauge: 17 G Needle length: 9 cm and 9 Needle insertion depth: 8 cm Catheter type: closed end flexible Catheter size: 19 Gauge Catheter at skin depth: 15 cm Test dose: negative  Assessment Events: blood not aspirated, injection not painful, no injection resistance, negative IV test and no paresthesia  Additional Notes Dosing of Epidural:  1st dose, through catheter .............................................  Xylocaine 40 mg  2nd dose, through catheter, after waiting 3 minutes.........Xylocaine 60 mg    As each dose occurred, patient was free of IV sx; and patient exhibited no evidence of SA injection.  Patient is more comfortable after epidural dosed. Please see RN's note for documentation of vital signs,and FHR which are stable.  Patient reminded not to try to ambulate with numb legs, and that an RN must be present when she attempts to get up.        

## 2016-08-17 NOTE — Anesthesia Pain Management Evaluation Note (Signed)
  CRNA Pain Management Visit Note  Patient: Kendra [name], 26 y.o., female  "Hello I am a member of the anesthesia team at [facility]. We have an anesthesia team available at all times to provide care throughout the hospital, including epidural management and anesthesia for C-section. I don't know your plan for the delivery whether it a natural birth, water birth, IV sedation, nitrous supplementation, doula or epidural, but we want to meet your pain goals."   1.Was your pain managed to your expectations on prior hospitalizations?   Yes   2.What is your expectation for pain management during this hospitalization?     Epidural  3.How can we help you reach that goal? epidural  Record the patient's initial score and the patient's pain goal.   Pain: 2  Pain Goal: 4 [facility] wants you to be able to say your pain was always managed very well.  Madison Hickman 08/17/2016

## 2016-08-17 NOTE — Progress Notes (Signed)
Patient ID: VIANNA VENEZIA, female   DOB: Mar 17, 1991, 26 y.o.   MRN: [id number]  Comfortable w/ epidural  BP 123/65, other VSS FHR 130s, +accels, occ mi variables Ctx q 2-3 mins w/ Pit @ 46mu/min Cx ext os 4cm/int os 1cm/thick/-3  IUP@term  cHTN Unfavorable cx  Cervical foley inserted without difficulty and taped w/ traction Will hold Pit steady at 52mu/min and then increase it when foley comes out  Cam Hai CNM 08/17/2016 10:25 PM

## 2016-08-17 NOTE — H&P (Signed)
LABOR AND DELIVERY ADMISSION HISTORY AND PHYSICAL NOTE  Kendra [name] is a 26 y.o. female [phone] with IUP at [redacted]w[redacted]d by ultrasound presenting for IOL for [facility].   She reports positive fetal movement. She denies leakage of fluid or vaginal bleeding. She denies HA, changes in vision, RUQ/epigastric pain.  Prenatal History/Complications:  CHTN  Past Medical History: Past Medical History:  Diagnosis Date  . Headache(784.0)   . Hypertension     Past Surgical History: Past Surgical History:  Procedure Laterality Date  . CHOLECYSTECTOMY    . nexplanon Left 07/03/2012   [id number]  . TUBAL LIGATION  07/2010   got pregnant in 2013 despite tubal so inserted nexplanon 06/2012  . WISDOM TOOTH EXTRACTION      Obstetrical History: OB History    Gravida Para Term Preterm AB Living   0 1 3   SAB TAB Ectopic Multiple Live Births   1 0 0 0 3      Social History: Social History   Social History  . Marital status: Single    Spouse name: N/A  . Number of children: N/A  . Years of education: N/A   Social History Main Topics  . Smoking status: Never Smoker  . Smokeless tobacco: Never Used  . Alcohol use No  . Drug use: No  . Sexual activity: Yes    Birth control/ protection: Surgical   Other Topics Concern  . Not on file   Social History Narrative  . No narrative on file    Family History: Family History  Problem Relation Age of Onset  . Asthma Maternal Grandmother   . COPD Maternal Grandmother     Allergies: No Known Allergies  Prescriptions Prior to Admission  Medication Sig Dispense Refill Last Dose  . acetaminophen (TYLENOL) 325 MG tablet Take 650 mg by mouth every 6 (six) hours as needed.   Taking  . cyclobenzaprine (FLEXERIL) 10 MG tablet Take 1 tablet (10 mg total) by mouth 3 times/day as needed-between meals & bedtime for muscle spasms. (Patient not taking: Reported on 08/13/2016) 30 tablet 1 Not Taking  . Prenatal Vit-Fe Fumarate-FA (PRENATAL  MULTIVITAMIN) TABS tablet Take 1 tablet by mouth daily at 12 noon.   Taking     Review of Systems   All systems reviewed and negative except as stated in HPI  Physical Exam Blood pressure 128/82, pulse 94, resp. rate 18, height  (1.727 m), weight 300 lb (136.1 kg). General appearance: alert, cooperative, appears stated age and no distress Lungs: clear to auscultation bilaterally Heart: regular rate and rhythm Abdomen: soft, non-tender; bowel sounds normal Extremities: No calf swelling or tenderness Presentation: cephalic Fetal monitoring: 130 bpm, mod var, +accels, no decels Uterine activity: None     Prenatal labs: ABO, Rh: O/Positive/-- (02/19 0904) Antibody: Negative (02/19 0904) Rubella: !Error! RPR: Non Reactive (02/19 0904)  HBsAg: Negative (02/19 0904)  HIV: Non Reactive (02/19 0904)  GBS: Positive (03/27 0000)  2 hr Glucola: 71/115/92 - normal Genetic screening:  Normal Anatomy US: Normal  Prenatal Transfer Tool  Maternal Diabetes: No Genetic Screening: Normal Maternal Ultrasounds/Referrals: Normal Fetal Ultrasounds or other Referrals:  None Maternal Substance Abuse:  No Significant Maternal Medications:  None Significant Maternal Lab Results: Lab values include: Group B Strep positive  No results found for this or any previous visit (from the past 24 hour(s)).  Patient Active Problem List   Diagnosis Date Noted  . Late prenatal care 08/10/2016  . Chronic hypertension  during pregnancy, antepartum 08/10/2016  . Supervision of high risk pregnancy, antepartum 06/14/2016  . GBS (group B Streptococcus carrier), +RV culture, currently pregnant 06/15/2010    Assessment: Kendra [name] is a 26 y.o. [phone] at [redacted]w[redacted]d here for IOL 2/2 CHTN (controlled, not on meds)  #Labor:IOL with cytotec #Pain: Epidural on request #FWB: Cat I #ID:  GBS Pos - PCN #MOF: Breast #MOC:Nexplanon #Circ:  Yes, outpatient  Jen Mow, DO OB Fellow Center for [facility], [facility] 08/17/2016, 8:56 AM

## 2016-08-18 ENCOUNTER — Encounter (HOSPITAL_COMMUNITY): Payer: Self-pay

## 2016-08-18 DIAGNOSIS — O99824 Streptococcus B carrier state complicating childbirth: Secondary | ICD-10-CM

## 2016-08-18 DIAGNOSIS — O1092 Unspecified pre-existing hypertension complicating childbirth: Secondary | ICD-10-CM

## 2016-08-18 DIAGNOSIS — Z3A39 39 weeks gestation of pregnancy: Secondary | ICD-10-CM

## 2016-08-18 LAB — RPR: RPR: NONREACTIVE

## 2016-08-18 MED ORDER — MEASLES, MUMPS & RUBELLA VAC ~~LOC~~ INJ
0.5000 mL | INJECTION | Freq: Once | SUBCUTANEOUS | Status: DC
  Filled 2016-08-18: qty 0.5

## 2016-08-18 MED ORDER — OXYCODONE HCL 5 MG PO TABS: 10.0000 mg | ORAL_TABLET | ORAL | Status: DC | PRN

## 2016-08-18 MED ORDER — METHYLERGONOVINE MALEATE 0.2 MG/ML IJ SOLN: 0.2000 mg | INTRAMUSCULAR | Status: DC | PRN

## 2016-08-18 MED ORDER — ONDANSETRON HCL 4 MG/2ML IJ SOLN: 4.0000 mg | INTRAMUSCULAR | Status: DC | PRN

## 2016-08-18 MED ORDER — BENZOCAINE-MENTHOL 20-0.5 % EX AERO: 1.0000 "application " | INHALATION_SPRAY | CUTANEOUS | Status: DC | PRN

## 2016-08-18 MED ORDER — OXYCODONE HCL 5 MG PO TABS: 5.0000 mg | ORAL_TABLET | ORAL | Status: DC | PRN

## 2016-08-18 MED ORDER — ZOLPIDEM TARTRATE 5 MG PO TABS: 5.0000 mg | ORAL_TABLET | Freq: Every evening | ORAL | Status: DC | PRN

## 2016-08-18 MED ORDER — SIMETHICONE 80 MG PO CHEW: 80.0000 mg | CHEWABLE_TABLET | ORAL | Status: DC | PRN

## 2016-08-18 MED ORDER — DIBUCAINE 1 % RE OINT: 1.0000 "application " | TOPICAL_OINTMENT | RECTAL | Status: DC | PRN

## 2016-08-18 MED ORDER — ERYTHROMYCIN 5 MG/GM OP OINT
TOPICAL_OINTMENT | OPHTHALMIC | Status: AC
  Filled 2016-08-18: qty 1

## 2016-08-18 MED ORDER — ONDANSETRON HCL 4 MG PO TABS: 4.0000 mg | ORAL_TABLET | ORAL | Status: DC | PRN

## 2016-08-18 MED ORDER — COCONUT OIL OIL: 1.0000 "application " | TOPICAL_OIL | Status: DC | PRN

## 2016-08-18 MED ORDER — DIPHENHYDRAMINE HCL 25 MG PO CAPS: 25.0000 mg | ORAL_CAPSULE | Freq: Four times a day (QID) | ORAL | Status: DC | PRN

## 2016-08-18 MED ORDER — TETANUS-DIPHTH-ACELL PERTUSSIS 5-2.5-18.5 LF-MCG/0.5 IM SUSP: 0.5000 mL | Freq: Once | INTRAMUSCULAR | Status: DC

## 2016-08-18 MED ORDER — METHYLERGONOVINE MALEATE 0.2 MG PO TABS: 0.2000 mg | ORAL_TABLET | ORAL | Status: DC | PRN

## 2016-08-18 MED ORDER — IBUPROFEN 600 MG PO TABS
600.0000 mg | ORAL_TABLET | Freq: Four times a day (QID) | ORAL | Status: DC
Start: 2016-08-18 — End: 2016-08-19
  Administered 2016-08-18 – 2016-08-19 (×5): 600 mg via ORAL
  Filled 2016-08-18 (×5): qty 1

## 2016-08-18 MED ORDER — ACETAMINOPHEN 325 MG PO TABS: 650.0000 mg | ORAL_TABLET | ORAL | Status: DC | PRN

## 2016-08-18 MED ORDER — WITCH HAZEL-GLYCERIN EX PADS: 1.0000 "application " | MEDICATED_PAD | CUTANEOUS | Status: DC | PRN

## 2016-08-18 MED ORDER — PRENATAL MULTIVITAMIN CH
1.0000 | ORAL_TABLET | Freq: Every day | ORAL | Status: DC
Start: 2016-08-18 — End: 2016-08-19
  Administered 2016-08-18 – 2016-08-19 (×2): 1 via ORAL
  Filled 2016-08-18 (×2): qty 1

## 2016-08-18 MED ORDER — SENNOSIDES-DOCUSATE SODIUM 8.6-50 MG PO TABS
2.0000 | ORAL_TABLET | ORAL | Status: DC
  Administered 2016-08-18: 2 via ORAL
  Filled 2016-08-18: qty 2

## 2016-08-18 NOTE — Lactation Note (Signed)
This note was copied from a baby's chart. Lactation Consultation Note  Patient Name: Kendra [name] [id number] Date: 08/18/2016 Reason for consult: Initial assessment Breastfeeding consultation services and support information given to mom.  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  Mom has used a nipple shield with previous babies.  Mom just finished pumping with the symphony pump and obtained 15 mls of colostrum. She is planning on giving milk to baby by bottle.  Baby awake and I offered to assist her with a nipple shield but she states she will call later when visitors leave.  Maternal Data Does the patient have breastfeeding experience prior to this delivery?: Yes  Feeding    LATCH Score/Interventions                      Lactation Tools Discussed/Used Pump Review: Setup, frequency, and cleaning;Milk Storage Initiated by:: RN Date initiated:: 08/18/16   Consult Status Consult Status: Follow-up Date: 08/19/16 Follow-up type: In-patient    Huston Foley 08/18/2016, 2:55 PM

## 2016-08-18 NOTE — Progress Notes (Signed)
Foley out at 0130 with spontaneous rupture of membranes, fluid clear. Patient comfortable with epidural.   Objective: BP (!) 125/58   Pulse 85   Temp 97.8 F (36.6 C) (Oral)   Resp 20   Ht  (1.727 m)   Wt 300 lb (136.1 kg)   SpO2 99%   BMI 45.61 kg/m  I/O last 3 completed shifts: In: 100 [IV Piggyback:100] Out: -  Total I/O In: 50 [IV Piggyback:50] Out: 3200 [Urine:3200]  FHT:  FHR: 130s bpm, variability: moderate,  accelerations:  Present,  decelerations:  Present occasional variable UC:   regular, every 2-4 minutes SVE:   Dilation: 6.5 Effacement (%): 90 Station: -1 Exam by:: Johnette RN    Assessment / Plan: IUP at term. CHTN.   Continue pitocin. Will check cervix in 2-3 hours.   Cleone Slim 08/18/2016, 3:47 AM   I confirm that I have verified the information documented in the student nurse midwife's note and that I have also personally reperformed the physical exam and all medical decision making activities.  Cam Hai CNM 08/18/2016 10:09 AM

## 2016-08-18 NOTE — Anesthesia Postprocedure Evaluation (Signed)
Anesthesia Post Note  Patient: Kendra [name]  Procedure(s) Performed: * No procedures listed *  Patient location during evaluation: Mother Baby Anesthesia Type: Epidural Level of consciousness: awake and alert, oriented and patient cooperative Pain management: pain level controlled Vital Signs Assessment: post-procedure vital signs reviewed and stable Respiratory status: spontaneous breathing Cardiovascular status: stable Postop Assessment: no headache, epidural receding, patient able to bend at knees and no signs of nausea or vomiting Anesthetic complications: no Comments: Pain score 0.        Last Vitals:  Vitals:   08/18/16 1205 08/18/16 1425  BP: 124/66 133/69  Pulse: 77 94  Resp:    Temp:  36.3 C    Last Pain:  Vitals:   08/18/16 1425  TempSrc: Oral  PainSc:    Pain Goal:                 [facility]

## 2016-08-19 MED ORDER — IBUPROFEN 600 MG PO TABS: 600.0000 mg | ORAL_TABLET | Freq: Four times a day (QID) | ORAL | 0 refills | Status: DC

## 2016-08-19 MED ORDER — DOCUSATE SODIUM 100 MG PO CAPS: 100.0000 mg | ORAL_CAPSULE | Freq: Two times a day (BID) | ORAL | 0 refills | Status: DC

## 2016-08-19 NOTE — Lactation Note (Signed)
This note was copied from a baby's chart. Lactation Consultation Note  Patient Name: Boy Antonya Leeder [id number] Date: 08/19/2016  Mom pumping every 3 hours and obtaining 10-20 mls of colostrum.  She and baby getting ready for discharge.  She will pump with the manual pump and call [facility] tomorrow for electric pump. Encouraged to call prn.   Maternal Data    Feeding    LATCH Score/Interventions                      Lactation Tools Discussed/Used     Consult Status      Huston Foley 08/19/2016, 2:59 PM

## 2016-08-19 NOTE — Clinical Social Work Maternal (Signed)
CLINICAL SOCIAL WORK MATERNAL/CHILD NOTE  Patient Details  Name: Kendra [name] MRN: [id number] Date of Birth: 12/06/1990  Date:  08/19/2016  Clinical Social Worker Initiating Note:  Quintan Saldivar, MSW, LCSW-A   Date/ Time Initiated:  08/19/16/1223              Child's Name:  Jimmy Brown III   Legal Guardian:  Other (Comment) (Not estbalished by court system; MOB and FOB parent collectively )   Need for Interpreter:  None   Date of Referral:  08/18/16     Reason for Referral:  Late or No Prenatal Care    Referral Source:  [facility]   Address:  [address] [place] [address]  Phone number:  [phone]   Household Members: Self, Significant Other   Natural Supports (not living in the home): Friends, Extended Family   Professional Supports:None   Employment:    Type of Work:     Education:  9 to 11 years   Financial Resources:Medicaid   Other Resources:     Cultural/Religious Considerations Which May Impact Care: None reported.   Strengths: Ability to meet basic needs , Compliance with medical plan , Home prepared for child    Risk Factors/Current Problems: None   Cognitive State: Able to Concentrate , Alert , Goal Oriented , Insightful    Mood/Affect: Bright , Calm , Interested , Happy    CSW Assessment:CSW met with MOB at bedside to complete assessment for consult regarding LPNC. Upon this writers arrival, MOB was laying in bed while baby was being photographed by new born photography. With MOB's permission, this writer explained role and reasoning for visit. MOB confirms she did have LPNC due to having moved from [place], [place] to [place] and having to burry her father. MOB notes it was hard at the time to get settled and find and OBGYN at that time. This writer inquired if there will be any barriers for she and baby getting to and from doctors apts upon d/c. MOB notes there are none and she is  prepared for baby's arrival. At this time, CSW has no concerns with babys d/c home in MOB's care.   CSW Plan/Description: Information/Referral to Community Resources , Patient/Family Education , No Further Intervention Required/No Barriers to Discharge   Ila Landowski, MSW, LCSW-A Clinical Social Worker  [facility]  Office: [phone]    CLINICAL SOCIAL WORK MATERNAL/CHILD NOTE  Patient Details  Name: Kendra [name] MRN: [id number] Date of Birth: [birth year]  Date:  08/19/2016  Clinical Social Worker Initiating Note:  Ferdinand Lango Marlean Mortell, MSW, LCSW-A  Date/ Time Initiated:  08/19/16/1223     Child's Name:  Vicenta Dunning III   Legal Guardian:  Other (Comment) (Not estbalished by court system; MOB and FOB parent collectively )   Need for Interpreter:  None   Date of Referral:  08/18/16     Reason for Referral:  Late or No Prenatal Care    Referral Source:  [facility]   Address:  [address] [place] [address]  Phone number:  [phone]   Household Members:  Self, Significant Other   Natural Supports (not living in the home):  Friends, Extended Family   Professional Supports: None   Employment:     Type of Work:     Education:  9 to 11 years   Museum/gallery curator Resources:  Medicaid   Other Resources:      Cultural/Religious Considerations Which May Impact Care:  None reported.   Strengths:  Ability to meet basic needs , Compliance with medical plan , Home prepared for child    Risk Factors/Current Problems:  None   Cognitive State:  Able to Concentrate , Alert , Goal Oriented , Insightful    Mood/Affect:  Bright , Calm , Interested , Happy    CSW Assessment: CSW met with MOB at bedside to complete assessment for consult regarding [place]. Upon this writers arrival, MOB was laying in bed while baby was being photographed by new born photography. With MOB's permission, this writer explained role and reasoning for visit. MOB confirms she did have [place] due to having moved from [place], [place] to [place] and having to burry her father. MOB notes it was hard at the time to get settled and find and OBGYN at that time. This Probation officer inquired if there will be any barriers for she and baby getting to and from doctors apts upon d/c. MOB notes there are none and she is prepared for baby's arrival. At  this time, CSW has no concerns with babys d/c home in MOB's care.   CSW Plan/Description:  Information/Referral to [employer] , [employer] , No Further Intervention Required/No Barriers to Discharge   [employer], MSW, [facility]  Office: [phone]

## 2016-08-19 NOTE — Discharge Instructions (Signed)
Postpartum Hypertension  Postpartum hypertension is high blood pressure after pregnancy that remains higher than normal for more than two days after delivery. You may not realize that you have postpartum hypertension if your blood pressure is not being checked regularly. In some cases, postpartum hypertension will go away on its own, usually within a week of delivery. However, for some women, medical treatment is required to prevent serious complications, such as seizures or stroke.  The following things can affect your blood pressure:  · The type of delivery you had.  · Having received IV fluids or other medicines during or after delivery.    What are the causes?  Postpartum hypertension may be caused by any of the following or by a combination of any of the following:  · Hypertension that existed before pregnancy (chronic hypertension).  · Gestational hypertension.  · Preeclampsia or eclampsia.  · Receiving a lot of fluid through an IV during or after delivery.  · Medicines.  · HELLP syndrome.  · Hyperthyroidism.  · Stroke.  · Other rare neurological or blood disorders.    In some cases, the cause may not be known.  What increases the risk?  Postpartum hypertension can be related to one or more risk factors, such as:  · Chronic hypertension. In some cases, this may not have been diagnosed before pregnancy.  · Obesity.  · Type 2 diabetes.  · Kidney disease.  · Family history of preeclampsia.  · Other medical conditions that cause hormonal imbalances.    What are the signs or symptoms?  As with all types of hypertension, postpartum hypertension may not have any symptoms. Depending on how high your blood pressure is, you may experience:  · Headaches. These may be mild, moderate, or severe. They may also be steady, constant, or sudden in onset (thunderclap headache).  · Visual changes.  · Dizziness.  · Shortness of breath.  · Swelling of your hands, feet, lower legs, or face. In some cases, you may have swelling in  more than one of these locations.  · Heart palpitations or a racing heartbeat.  · Difficulty breathing while lying down.  · Decreased urination.    Other rare signs and symptoms may include:  · Sweating more than usual. This lasts longer than a few days after delivery.  · Chest pain.  · Sudden dizziness when you get up from sitting or lying down.  · Seizures.  · Nausea or vomiting.  · Abdominal pain.    How is this diagnosed?  The diagnosis of postpartum hypertension is made through a combination of physical examination findings and testing of your blood and urine. You may also have additional tests, such as a CT scan or an MRI, to check for other complications of postpartum hypertension.  How is this treated?  When blood pressure is high enough to require treatment, your options may include:  · Medicines to reduce blood pressure (antihypertensives). Tell your health care provider if you are breastfeeding or if you plan to breastfeed. There are many antihypertensive medicines that are safe to take while breastfeeding.  · Stopping medicines that may be causing hypertension.  · Treating medical conditions that are causing hypertension.  · Treating the complications of hypertension, such as seizures, stroke, or kidney problems.    Your health care provider will also continue to monitor your blood pressure closely and repeatedly until it is within a safe range for you.  Follow these instructions at home:  · Take medicines only   directed by your health care provider. This is important. °Contact a health care provider if: °· Your symptoms  get worse. °· You have new symptoms, such as: °¨ Headache. °¨ Dizziness. °¨ Visual changes. °Get help right away if: °· You develop a severe or sudden headache. °· You have seizures. °· You develop numbness or weakness on one side of your body. °· You have difficulty thinking, speaking, or swallowing. °· You develop severe abdominal pain. °· You develop difficulty breathing, chest pain, a racing heartbeat, or heart palpitations. °These symptoms may represent a serious problem that is an emergency. Do not wait to see if the symptoms will go away. Get medical help right away. Call your local emergency services (911 in the [place] Do not drive yourself to the hospital.  °This information is not intended to replace advice given to you by your health care provider. Make sure you discuss any questions you have with your health care provider. °Document Released: 12/18/2013 Document Revised: 09/19/2015 Document Reviewed: 10/29/2013 °Elsevier Interactive Patient Education © 2017 [employer] ° ° °Vaginal Delivery, Care After °Refer to this sheet in the next few weeks. These instructions provide you with information about caring for yourself after vaginal delivery. Your health care provider may also give you more specific instructions. Your treatment has been planned according to current medical practices, but problems sometimes occur. Call your health care provider if you have any problems or questions. °What can I expect after the procedure? °After vaginal delivery, it is common to have: °· Some bleeding from your vagina. °· Soreness in your abdomen, your vagina, and the area of skin between your vaginal opening and your anus (perineum). °· Pelvic cramps. °· Fatigue. °Follow these instructions at home: °Medicines °· Take over-the-counter and prescription medicines only as told by your health care provider. °· If you were prescribed an antibiotic medicine, take it as told by your health care provider. Do not stop taking the  antibiotic until it is finished. °Driving °· Do not drive or operate heavy machinery while taking prescription pain medicine. °· Do not drive for 24 hours if you received a sedative. °Lifestyle °· Do not drink alcohol. This is especially important if you are breastfeeding or taking medicine to relieve pain. °· Do not use tobacco products, including cigarettes, chewing tobacco, or e-cigarettes. If you need help quitting, ask your health care provider. °Eating and drinking °· Drink at least 8 eight-ounce glasses of water every day unless you are told not to by your health care provider. If you choose to breastfeed your baby, you may need to drink more water than this. °· Eat high-fiber foods every day. These foods may help prevent or relieve constipation. High-fiber foods include: °¨ Whole grain cereals and breads. °¨ Brown rice. °¨ Beans. °¨ Fresh fruits and vegetables. °Activity °· Return to your normal activities as told by your health care provider. Ask your health care provider what activities are safe for you. °· Rest as much as possible. Try to rest or take a nap when your baby is sleeping. °· Do not lift anything that is heavier than your baby or 10 lb (4.5 kg) until your health care provider says that it is safe. °· Talk with your health care provider about when you can engage in sexual activity. This may depend on your: °¨ Risk of infection. °¨ Rate of healing. °¨ Comfort and desire to engage in sexual activity. °Vaginal Care °· If you have an episiotomy or a vaginal tear,   have an episiotomy or a vaginal tear, check the area every day for signs of infection. Check for:  More redness, swelling, or pain.  More fluid or blood.  Warmth.  Pus or a bad smell.  Do not use tampons or douches until your health care provider says this is safe.  Watch for any blood clots that may pass from your vagina. These may look like clumps of dark red, brown, or black discharge. General instructions   Keep your perineum clean and dry as told  by your health care provider.  Wear loose, comfortable clothing.  Wipe from front to back when you use the toilet.  Ask your health care provider if you can shower or take a bath. If you had an episiotomy or a perineal tear during labor and delivery, your health care provider may tell you not to take baths for a certain length of time.  Wear a bra that supports your breasts and fits you well.  If possible, have someone help you with household activities and help care for your baby for at least a few days after you leave the hospital.  Keep all follow-up visits for you and your baby as told by your health care provider. This is important. Contact a health care provider if:  You have:  Vaginal discharge that has a bad smell.  Difficulty urinating.  Pain when urinating.  A sudden increase or decrease in the frequency of your bowel movements.  More redness, swelling, or pain around your episiotomy or vaginal tear.  More fluid or blood coming from your episiotomy or vaginal tear.  Pus or a bad smell coming from your episiotomy or vaginal tear.  A fever.  A rash.  Little or no interest in activities you used to enjoy.  Questions about caring for yourself or your baby.  Your episiotomy or vaginal tear feels warm to the touch.  Your episiotomy or vaginal tear is separating or does not appear to be healing.  Your breasts are painful, hard, or turn red.  You feel unusually sad or worried.  You feel nauseous or you vomit.  You pass large blood clots from your vagina. If you pass a blood clot from your vagina, save it to show to your health care provider. Do not flush blood clots down the toilet without having your health care provider look at them.  You urinate more than usual.  You are dizzy or light-headed.  You have not breastfed at all and you have not had a menstrual period for 12 weeks after delivery.  You have stopped breastfeeding and you have not had a menstrual  period for 12 weeks after you stopped breastfeeding. Get help right away if:  You have:  Pain that does not go away or does not get better with medicine.  Chest pain.  Difficulty breathing.  Blurred vision or spots in your vision.  Thoughts about hurting yourself or your baby.  You develop pain in your abdomen or in one of your legs.  You develop a severe headache.  You faint.  You bleed from your vagina so much that you fill two sanitary pads in one hour. This information is not intended to replace advice given to you by your health care provider. Make sure you discuss any questions you have with your health care provider. Document Released: 04/13/2000 Document Revised: 09/28/2015 Document Reviewed: 05/01/2015 Elsevier Interactive Patient Education  2017 [employer].

## 2016-08-19 NOTE — Discharge Summary (Signed)
OB Discharge Summary     Patient Name: Kendra [name] DOB: 03/24/1991 MRN: [id number]  Date of admission: 08/17/2016 Delivering MD: Rolm Bookbinder   Date of discharge: 08/19/2016  Admitting diagnosis: INDUCTION Intrauterine pregnancy: [redacted]w[redacted]d     Secondary diagnosis:  Principal Problem:   NSVD (normal spontaneous vaginal delivery) Active Problems:   Chronic hypertension during pregnancy, antepartum  Additional problems: None     Discharge diagnosis: Term Pregnancy Delivered and CHTN                                                                                                Post partum procedures:None  Augmentation: Pitocin, Cytotec and Foley Balloon  Complications: None  Hospital course:  Induction of Labor With Vaginal Delivery   26 y.o. yo [id number] at [redacted]w[redacted]d was admitted to the hospital 08/17/2016 for induction of labor.  Indication for induction: Chronic hypertension.  Patient had an uncomplicated labor course as follows: Membrane Rupture Time/Date: 1:20 AM ,08/18/2016   Intrapartum Procedures: Episiotomy: None [1]                                         Lacerations:  None [1]  Patient had delivery of a Viable infant.  Information for the patient's newborn:  Nikitta, Sobiech [[id number]]  Delivery Method: Vaginal, Spontaneous Delivery (Filed from Delivery Summary)   08/18/2016  Details of delivery can be found in separate delivery note.  Patient had a routine postpartum course. Patient is discharged home 08/19/16.  Physical exam  Vitals:   08/18/16 1800 08/18/16 1850 08/18/16 2258 08/19/16 0630  BP: (!) 147/80 121/63 132/65 (!) 117/59  Pulse: 92 81 87 75  Resp: 18   18  Temp: 97.7 F (36.5 C)   98.2 F (36.8 C)  TempSrc: Oral     SpO2: 96%     Weight:      Height:       General: alert, cooperative and no distress Lochia: appropriate Uterine Fundus: firm Incision: N/A DVT Evaluation: No evidence of DVT seen on physical exam. Negative Homan's  sign. No cords or calf tenderness. Labs: Lab Results  Component Value Date   WBC 7.8 08/17/2016   HGB 15.3 (H) 08/17/2016   HCT 45.0 08/17/2016   MCV 89.8 08/17/2016   PLT 287 08/17/2016   CMP Latest Ref Rng & Units 06/11/2016  Glucose 65 - 99 mg/dL 83  BUN 6 - 20 mg/dL 6  Creatinine 6.96 - 2.95 mg/dL 2.84  Sodium 132 - 440 mmol/L 134(L)  Potassium 3.5 - 5.1 mmol/L 3.7  Chloride 101 - 111 mmol/L 107  CO2 22 - 32 mmol/L 21(L)  Calcium 8.9 - 10.3 mg/dL 8.3(L)  Total Protein 6.5 - 8.1 g/dL 6.3(L)  Total Bilirubin 0.3 - 1.2 mg/dL 0.8  Alkaline Phos 38 - 126 U/L 72  AST 15 - 41 U/L 16  ALT 14 - 54 U/L 19    Discharge instruction: per After Visit Summary and "Baby and Me Booklet".  After visit meds:  Allergies as of 08/19/2016   No Known Allergies     Medication List    TAKE these medications   acetaminophen 325 MG tablet Commonly known as:  TYLENOL Take 650 mg by mouth every 6 (six) hours as needed.   cyclobenzaprine 10 MG tablet Commonly known as:  FLEXERIL Take 1 tablet (10 mg total) by mouth 3 times/day as needed-between meals & bedtime for muscle spasms.   docusate sodium 100 MG capsule Commonly known as:  COLACE Take 1 capsule (100 mg total) by mouth 2 (two) times daily.   ibuprofen 600 MG tablet Commonly known as:  ADVIL,MOTRIN Take 1 tablet (600 mg total) by mouth every 6 (six) hours.   prenatal multivitamin Tabs tablet Take 1 tablet by mouth daily at 12 noon.       Diet: routine diet  Activity: Advance as tolerated. Pelvic rest for 6 weeks.   Outpatient follow up:1 week for a BP check; 6 week for postparum care Follow up Appt:Future Appointments Date Time Provider Department Center  09/27/2016 9:00 AM [place], MD [facility]   Follow up Visit:No Follow-up on file.  Postpartum contraception: Nexplanon  Newborn Data: Live born female  Birth Weight: 7 lb 14.8 oz (3595 g) APGAR: 9, 9  Baby Feeding: Bottle and  Breast Disposition:home with mother   08/19/2016 Jen Mow, DO OB Fellow

## 2016-08-20 ENCOUNTER — Telehealth: Payer: Self-pay | Admitting: *Deleted

## 2016-08-20 NOTE — Telephone Encounter (Signed)
Called pt, no answer, left VM to call the office and schedule appt for BP in one week.

## 2016-08-20 NOTE — Telephone Encounter (Signed)
-----   Message from Elizabeth Woodland Mumaw, DO sent at 08/19/2016  9:52 AM EDT ----- Regarding: Appt needed for BP check Patient just delivered, being discharged today 4/22, needs a blood pressure check in the office in 1 week due to chronic hypertension, please set her up and inform of time/date.   Thank you  E. Mumaw, DO 

## 2016-08-20 NOTE — Telephone Encounter (Signed)
-----   Message from [facility], DO sent at 08/19/2016  9:52 AM EDT ----- Regarding: Appt needed for BP check Patient just delivered, being discharged today 4/22, needs a blood pressure check in the office in 1 week due to chronic hypertension, please set her up and inform of time/date.   Thank you  E. Mumaw, DO

## 2016-08-20 NOTE — Telephone Encounter (Signed)
Pt returned call, scheduled BP check for 08-27-16.

## 2016-08-22 ENCOUNTER — Encounter: Payer: Medicaid Other | Admitting: Obstetrics & Gynecology

## 2016-08-24 ENCOUNTER — Encounter: Payer: Medicaid Other | Admitting: Obstetrics and Gynecology

## 2016-09-27 ENCOUNTER — Ambulatory Visit (INDEPENDENT_AMBULATORY_CARE_PROVIDER_SITE_OTHER): Payer: Medicaid Other | Admitting: Obstetrics and Gynecology

## 2016-09-27 ENCOUNTER — Encounter: Payer: Self-pay | Admitting: Obstetrics and Gynecology

## 2016-09-27 DIAGNOSIS — Z309 Encounter for contraceptive management, unspecified: Secondary | ICD-10-CM | POA: Insufficient documentation

## 2016-09-27 DIAGNOSIS — Z3049 Encounter for surveillance of other contraceptives: Secondary | ICD-10-CM | POA: Diagnosis not present

## 2016-09-27 DIAGNOSIS — Z30017 Encounter for initial prescription of implantable subdermal contraceptive: Secondary | ICD-10-CM

## 2016-09-27 DIAGNOSIS — Z3202 Encounter for pregnancy test, result negative: Secondary | ICD-10-CM | POA: Diagnosis not present

## 2016-09-27 LAB — POCT URINE PREGNANCY: Preg Test, Ur: NEGATIVE

## 2016-09-27 MED ORDER — ETONOGESTREL 68 MG ~~LOC~~ IMPL
68.0000 mg | DRUG_IMPLANT | Freq: Once | SUBCUTANEOUS | Status: AC
  Administered 2016-09-27: 68 mg via SUBCUTANEOUS

## 2016-09-27 NOTE — Progress Notes (Signed)
Obstetrics Visit Postpartum Visit  Appointment Date: 09/27/2016  [facility]  Primary Care Provider: Patient, No Pcp Per  Chief Complaint:  Chief Complaint  Patient presents with  . Postpartum Care    History of Present Illness: Kendra [name] is a 26 y.o. [phone] (Patient's last menstrual period was 09/19/2016.), seen for the above chief complaint. Her past medical history is significant for BMI 40s, cHTN, h/o failed BTL  She is s/p SVD/intact perineum on 4/21; she was discharged to home on PPD#1  Vaginal bleeding or discharge: she had a normal period x 1 week that ended last week Breast or formula feeding: formula Intercourse: Yes, prior to LMP; no issues Contraception after delivery: condoms PP depression s/s: No  Any bowel or bladder issues: No  Pap smear: no abnormalities (date: 2017)  She had a nexplanon last time and liked it and desires another one.  Review of Systems:  as noted in the History of Present Illness.  Medications Kendra [name] had no medications administered during this visit. No current outpatient prescriptions on file.   No current facility-administered medications for this visit.     Allergies Patient has no known allergies.  Physical Exam:  BP 133/83 (BP Location: Left Arm, Patient Position: Sitting, Cuff Size: Large)   Pulse 69   Resp 18   Ht 5\' 8"  (1.727 m)   Wt 281 lb (127.5 kg)   LMP 09/19/2016   Breastfeeding? No   BMI 42.73 kg/m  Body mass index is 42.73 kg/m. General appearance: Well nourished, well developed female in no acute distress.  Respiratory: Normal respiratory effort Abdomen: positive bowel sounds and no masses, hernias; diffusely non tender to palpation, non distended Neuro/Psych:  Normal mood and affect.  Skin:  Warm and dry.   Laboratory: UPT negative  PP Depression Screening:  0 EPDS  Assessment: pt doing well  Plan:  See procedure note for uncomplicated  nexplanon insertion into LUE D/w pt to call PCP for routine visit sometime this year for routine follow up.  RTC PRN  Kendra Copaharlie Gertrue Willette, Jr MD Attending [facility](Faculty Practice)

## 2016-09-27 NOTE — Procedures (Signed)
Nexplanon Insertion Procedure Note Prior to the procedure being performed, the patient (or guardian) was asked to state their full name, date of birth, type of procedure being performed and the exact location of the operative site. This information was then checked against the documentation in the patient's chart. Prior to the procedure being performed, a "time out" was performed by the physician that confirmed the correct patient, procedure and site.  After informed consent was obtained, the patient's non-dominant left arm was chosen for insertion. A site was marked approximately 8 cm proximal to the medial epicondyle in the sulcus between the biceps and triceps on the inner surface. The area was cleaned with alcohol then local anesthesia was infiltrated with 3 ml of 1% lidocaine along the planned insertion track. The area was prepped with betadine. Using sterile technique the Nexplanon device was inserted per manufacturer's guidelines in the subdermal connective tissue using the standard insertion technique without difficulty. Pressure was applied and the insertion site was hemostatic. The presence of the Nexplanon was confirmed immediately after insertion by palpation by both me and the patient and by checking the tip of needle for the absence of the insert.  A pressure dressing was applied.   The patient tolerated the procedure well.  Yanett Conkright, Jr MD Attending [facility] ([facility]ulty [facility])  

## 2017-08-13 ENCOUNTER — Ambulatory Visit (HOSPITAL_COMMUNITY)
Admission: EM | Admit: 2017-08-13 | Discharge: 2017-08-13 | Disposition: A | Discharge status: Home / Self Care | Payer: Self-pay | Attending: Family Medicine | Admitting: Family Medicine

## 2017-08-13 ENCOUNTER — Encounter (HOSPITAL_COMMUNITY): Payer: Self-pay | Admitting: Family Medicine

## 2017-08-13 DIAGNOSIS — J020 Streptococcal pharyngitis: Secondary | ICD-10-CM

## 2017-08-13 LAB — POCT RAPID STREP A: STREPTOCOCCUS, GROUP A SCREEN (DIRECT): POSITIVE — AB

## 2017-08-13 MED ORDER — AMOXICILLIN 875 MG PO TABS: 875.0000 mg | ORAL_TABLET | Freq: Two times a day (BID) | ORAL | 0 refills | Status: AC

## 2017-08-13 NOTE — ED Provider Notes (Signed)
[facility]   [id number] 08/13/17 Arrival Time: 1025   SUBJECTIVE:  Kendra [name] is a 27 y.o. female who presents to the urgent care with complaint of sore throat since yesterday. Took motrin with some relief.   [facility]   [id number] 08/13/17 Arrival Time: 1025   SUBJECTIVE:  Kendra [name] is a 27 y.o. female who presents to the urgent care with complaint of sore throat for one day.  Works at [employer].     Past Medical History:  Diagnosis Date  . Headache(784.0)   . Hypertension    Family History  Problem Relation Age of Onset  . Asthma Maternal Grandmother   . COPD Maternal Grandmother    Social History   Socioeconomic History  . Marital status: Single    Spouse name: Not on file  . Number of children: Not on file  . Years of education: Not on file  . Highest education level: Not on file  Occupational History  . Not on file  Social Needs  . Financial resource strain: Not on file  . Food insecurity:    Worry: Not on file    Inability: Not on file  . Transportation needs:    Medical: Not on file    Non-medical: Not on file  Tobacco Use  . Smoking status: Never Smoker  . Smokeless tobacco: Never Used  Substance and Sexual Activity  . Alcohol use: No  . Drug use: No  . Sexual activity: Yes    Birth control/protection: Condom, Surgical  Lifestyle  . Physical activity:    Days per week: Not on file    Minutes per session: Not on file  . Stress: Not on file  Relationships  . Social connections:    Talks on phone: Not on file    Gets together: Not on file    Attends religious service: Not on file    Active member of club or organization: Not on file    Attends meetings of clubs or organizations: Not on file    Relationship status: Not on file  . Intimate partner violence:    Fear of current or ex partner: Not on file    Emotionally abused: Not on file    Physically abused: Not on file    Forced sexual activity:  Not on file  Other Topics Concern  . Not on file  Social History Narrative  . Not on file   No outpatient medications have been marked as taking for the 08/13/17 encounter [facility] Encounter).   No Known Allergies    ROS: As per HPI, remainder of ROS negative.   OBJECTIVE:   Vitals:   08/13/17 1057  BP: 114/62  Pulse: 78  Resp: 18  Temp: 98.6 F (37 C)  SpO2: 100%     General appearance: alert; no distress Eyes: PERRL; EOMI; conjunctiva normal HENT: normocephalic; atraumatic; TMs normal, canal normal, external ears normal without trauma; nasal mucosa normal; mildly swollen but very red tonsils and posterior pharynx Neck: supple Lungs: clear to auscultation bilaterally Heart: regular rate and rhythm Back: no CVA tenderness Extremities: no cyanosis or edema; symmetrical with no gross deformities Skin: warm and dry Neurologic: normal gait; grossly normal Psychological: alert and cooperative; normal mood and affect      Labs:  Results for orders placed or performed during the hospital encounter of 08/13/17  POCT rapid strep A [facility])  Result Value Ref Range   Streptococcus, Group A Screen (Direct)  POSITIVE (A) NEGATIVE    Labs Reviewed  POCT RAPID STREP A - Abnormal; Notable for the following components:      Result Value   Streptococcus, Group A Screen (Direct) POSITIVE (*)    All other components within normal limits    No results found.     ASSESSMENT & PLAN:  1. Strep throat     Meds ordered this encounter  Medications  . amoxicillin (AMOXIL) 875 MG tablet    Sig: Take 1 tablet (875 mg total) by mouth 2 (two) times daily.    Dispense:  20 tablet    Refill:  0    Reviewed expectations re: course of current medical issues. Questions answered. Outlined signs and symptoms indicating need for more acute intervention. Patient verbalized understanding. After Visit Summary given.      Past Medical History:  Diagnosis Date  .  Headache(784.0)   . Hypertension    Family History  Problem Relation Age of Onset  . Asthma Maternal Grandmother   . COPD Maternal Grandmother    Social History   Socioeconomic History  . Marital status: Single    Spouse name: Not on file  . Number of children: Not on file  . Years of education: Not on file  . Highest education level: Not on file  Occupational History  . Not on file  Social Needs  . Financial resource strain: Not on file  . Food insecurity:    Worry: Not on file    Inability: Not on file  . Transportation needs:    Medical: Not on file    Non-medical: Not on file  Tobacco Use  . Smoking status: Never Smoker  . Smokeless tobacco: Never Used  Substance and Sexual Activity  . Alcohol use: No  . Drug use: No  . Sexual activity: Yes    Birth control/protection: Condom, Surgical  Lifestyle  . Physical activity:    Days per week: Not on file    Minutes per session: Not on file  . Stress: Not on file  Relationships  . Social connections:    Talks on phone: Not on file    Gets together: Not on file    Attends religious service: Not on file    Active member of club or organization: Not on file    Attends meetings of clubs or organizations: Not on file    Relationship status: Not on file  . Intimate partner violence:    Fear of current or ex partner: Not on file    Emotionally abused: Not on file    Physically abused: Not on file    Forced sexual activity: Not on file  Other Topics Concern  . Not on file  Social History Narrative  . Not on file   No outpatient medications have been marked as taking for the 08/13/17 encounter [facility] Encounter).   No Known Allergies    ROS: As per HPI, remainder of ROS negative.   OBJECTIVE:   Vitals:   08/13/17 1057  BP: 114/62  Pulse: 78  Resp: 18  Temp: 98.6 F (37 C)  SpO2: 100%     General appearance: alert; no distress Eyes: PERRL; EOMI; conjunctiva normal HENT: normocephalic; atraumatic;  TMs normal, canal normal, external ears normal without trauma; nasal mucosa normal; oral mucosa normal Neck: supple Lungs: clear to auscultation bilaterally Heart: regular rate and rhythm Abdomen: soft, non-tender; bowel sounds normal; no masses or organomegaly; no guarding or rebound tenderness Back: no CVA tenderness  Extremities: no cyanosis or edema; symmetrical with no gross deformities Skin: warm and dry Neurologic: normal gait; grossly normal Psychological: alert and cooperative; normal mood and affect      Labs:  Results for orders placed or performed during the hospital encounter of 08/13/17  POCT rapid strep A [facility])  Result Value Ref Range   Streptococcus, Group A Screen (Direct) POSITIVE (A) NEGATIVE    Labs Reviewed  POCT RAPID STREP A - Abnormal; Notable for the following components:      Result Value   Streptococcus, Group A Screen (Direct) POSITIVE (*)    All other components within normal limits    No results found.     ASSESSMENT & PLAN:  1. Strep throat     Meds ordered this encounter  Medications  . amoxicillin (AMOXIL) 875 MG tablet    Sig: Take 1 tablet (875 mg total) by mouth 2 (two) times daily.    Dispense:  20 tablet    Refill:  0    Reviewed expectations re: course of current medical issues. Questions answered. Outlined signs and symptoms indicating need for more acute intervention. Patient verbalized understanding. After Visit Summary given.    Procedures:      Elvina SidleLauenstein, Kelvyn Schunk, MD 08/13/17 1112

## 2017-08-13 NOTE — ED Triage Notes (Signed)
Pt here for sore throat since yesterday. Took motrin with some relief.

## 2018-09-25 ENCOUNTER — Encounter (HOSPITAL_COMMUNITY): Payer: Self-pay | Admitting: Emergency Medicine

## 2018-09-25 ENCOUNTER — Other Ambulatory Visit: Payer: Self-pay

## 2018-09-25 ENCOUNTER — Emergency Department (HOSPITAL_COMMUNITY)
Admission: EM | Admit: 2018-09-25 | Discharge: 2018-09-25 | Disposition: A | Discharge status: Home / Self Care | Payer: Self-pay | Attending: Emergency Medicine | Admitting: Emergency Medicine

## 2018-09-25 DIAGNOSIS — S76011A Strain of muscle, fascia and tendon of right hip, initial encounter: Secondary | ICD-10-CM | POA: Insufficient documentation

## 2018-09-25 DIAGNOSIS — Y939 Activity, unspecified: Secondary | ICD-10-CM | POA: Insufficient documentation

## 2018-09-25 DIAGNOSIS — Y999 Unspecified external cause status: Secondary | ICD-10-CM | POA: Insufficient documentation

## 2018-09-25 DIAGNOSIS — Y929 Unspecified place or not applicable: Secondary | ICD-10-CM | POA: Insufficient documentation

## 2018-09-25 DIAGNOSIS — X58XXXA Exposure to other specified factors, initial encounter: Secondary | ICD-10-CM | POA: Insufficient documentation

## 2018-09-25 NOTE — Discharge Instructions (Addendum)
Please read attached information. If you experience any new or worsening signs or symptoms please return to the emergency room for evaluation. Please follow-up with your primary care provider or specialist as discussed.  °

## 2018-09-25 NOTE — ED Provider Notes (Signed)
[facility]   CSN: [id number] Arrival date & time: 09/25/18  1048   History   Chief Complaint Chief Complaint  Patient presents with  . R Leg pain  . Insect Bite    HPI Kendra [name] is a 28 y.o. female.     HPI    [age] YOF presents today with complaints of right-sided hip pain.  She notes a 2-day history of right-sided hip pain.  She notes that symptoms are resolved with rest, worse with walking or hip flexion extension.  She has full active range of motion of her hip, she denies any swelling rash or warmth.  She denies any fever.  She denies any trauma.  She reports that about 5 days ago she had several mosquito bites that are itchy otherwise no other complaints.  She denies any abdominal or pelvic pain.  She reports she is not pregnant.   Past Medical History:  Diagnosis Date  . Headache(784.0)   . Hypertension     Patient Active Problem List   Diagnosis Date Noted  . History of failed BTL 09/27/2016  . Late prenatal care 08/10/2016  . Chronic hypertension during pregnancy, antepartum 08/10/2016  . Supervision of high risk pregnancy, antepartum 06/14/2016    Past Surgical History:  Procedure Laterality Date  . CHOLECYSTECTOMY    . nexplanon Left 07/03/2012   [id number]  . TUBAL LIGATION  07/2010   got pregnant in 2013 despite tubal so inserted nexplanon 06/2012  . WISDOM TOOTH EXTRACTION       OB History    Gravida  5   Para  4   Term  4   Preterm  0   AB  1   Living  4     SAB  1   TAB  0   Ectopic  0   Multiple  0   Live Births  4            Home Medications    Prior to Admission medications   Medication Sig Start Date End Date Taking? Authorizing Provider  amoxicillin (AMOXIL) 875 MG tablet Take 1 tablet (875 mg total) by mouth 2 (two) times daily. 08/13/17   Elvina SidleLauenstein, Kurt, MD    Family History Family History  Problem Relation Age of Onset  . Asthma Maternal  Grandmother   . COPD Maternal Grandmother     Social History Social History   Tobacco Use  . Smoking status: Never Smoker  . Smokeless tobacco: Never Used  Substance Use Topics  . Alcohol use: No  . Drug use: No     Allergies   Patient has no known allergies.   Review of Systems Review of Systems  All other systems reviewed and are negative.    Physical Exam Updated Vital Signs BP 109/71 (BP Location: Right Arm)   Pulse 62   Temp 97.6 F (36.4 C) (Oral)   Resp 18   Ht 5\' 8"  (1.727 m)   Wt 117.9 kg   LMP 09/17/2018   SpO2 98%   BMI 39.53 kg/m   Physical Exam Vitals signs and nursing note reviewed.  Constitutional:      Appearance: She is well-developed.  HENT:     Head: Normocephalic and atraumatic.  Eyes:     General: No scleral icterus.       Right eye: No discharge.        Left eye: No discharge.     Conjunctiva/sclera: Conjunctivae  normal.     Pupils: Pupils are equal, round, and reactive to light.  Neck:     Musculoskeletal: Normal range of motion.     Vascular: No JVD.     Trachea: No tracheal deviation.  Pulmonary:     Effort: Pulmonary effort is normal.     Breath sounds: No stridor.  Abdominal:     Comments: Abdomen soft nontender  Musculoskeletal:     Comments: Right hip atraumatic no redness or warmth to touch-full active range of motion, pain with flexion both resisted and passive this is minimal-internal and external rotation without pain, no pain with palpation-several small areas of redness to the right distal thigh and calf no signs of significant cellulitis or induration-distal cap refill intact joint compartments are soft  Neurological:     Mental Status: She is alert and oriented to person, place, and time.     Coordination: Coordination normal.  Psychiatric:        Behavior: Behavior normal.        Thought Content: Thought content normal.        Judgment: Judgment normal.      ED Treatments / Results  Labs (all labs  ordered are listed, but only abnormal results are displayed) Labs Reviewed - No data to display  EKG None  Radiology No results found.  Procedures Procedures (including critical care time)  Medications Ordered in ED Medications - No data to display   Initial Impression / Assessment and Plan / ED Course  I have reviewed the triage vital signs and the nursing notes.  Pertinent labs & imaging results that were available during my care of the patient were reviewed by me and considered in my medical decision making (see chart for details).        Patient symptoms are most consistent with likely muscular strain.  She has no signs of infection, trauma or referred pain.  Patient encouraged use anti-inflammatories rest return if symptoms worsen.  She verbalized understanding and agreement to today's plan had no further questions or concerns.  Final Clinical Impressions(s) / ED Diagnoses   Final diagnoses:  Strain of right hip, initial encounter    ED Discharge Orders    None       Rosalio Loud 09/25/18 1244    Blane Ohara, MD 09/25/18 1549

## 2018-09-25 NOTE — ED Triage Notes (Signed)
Pt in with RLE itching/swelling after bug bite 5 days ago. Has 3 bites behind the knee. States leg goes numb and tingles with jolts of pain.

## 2018-12-13 ENCOUNTER — Encounter (HOSPITAL_COMMUNITY): Payer: Self-pay

## 2018-12-13 ENCOUNTER — Ambulatory Visit (HOSPITAL_COMMUNITY)
Admission: EM | Admit: 2018-12-13 | Discharge: 2018-12-13 | Disposition: A | Discharge status: Home / Self Care | Payer: Self-pay | Attending: Family Medicine | Admitting: Family Medicine

## 2018-12-13 ENCOUNTER — Other Ambulatory Visit: Payer: Self-pay

## 2018-12-13 DIAGNOSIS — J029 Acute pharyngitis, unspecified: Secondary | ICD-10-CM | POA: Insufficient documentation

## 2018-12-13 LAB — POCT RAPID STREP A: Streptococcus, Group A Screen (Direct): NEGATIVE

## 2018-12-13 NOTE — ED Triage Notes (Signed)
Pt present sore throat, symptoms started last night. Pt states that her throat is sore on the right side with difficulty swallowing.

## 2018-12-13 NOTE — ED Provider Notes (Signed)
[employer]   [id number] 12/13/18 Arrival Time: 3875  ASSESSMENT & PLAN:  1. Sore throat     No signs of peritonsillar abscess. Discussed.  Results for orders placed or performed during the hospital encounter of 12/13/18  POCT rapid strep A [facility])  Result Value Ref Range   Streptococcus, Group A Screen (Direct) NEGATIVE NEGATIVE   Throat culture sent.   Discharge Instructions      You may use over the counter ibuprofen or acetaminophen as needed.   For a sore throat, over the counter products such as [employer] Peroxyl Mouth Sore Rinse or Chloraseptic Sore Throat Spray may provide some temporary relief.  Your rapid strep test was negative today. We have sent your throat swab for culture and will let you know of any positive results.   Reviewed expectations re: course of current medical issues. Questions answered. Outlined signs and symptoms indicating need for more acute intervention. Patient verbalized understanding. After Visit Summary given.   SUBJECTIVE:  Kendra [name] is a 28 y.o. female who reports a sore throat. Describes as discomfort with swallowing; R-sided. Onset abrupt, noticed this am. No respiratory symptoms. Normal PO intake but reports discomfort with swallowing. Fever reported: no. No neck pain or swelling. No associated n/v/abdominal symptoms. Sick contacts: none known.  OTC treatment: Tylenol with mild help.  ROS: As per HPI.   OBJECTIVE:  Vitals:   12/13/18 1023  BP: 125/79  Pulse: 84  Resp: 16  Temp: 98.8 F (37.1 C)  TempSrc: Oral  SpO2: 99%     General appearance: alert; no distress HEENT: throat with mild erythema (R-sided); uvula midline: yes; tonsils appear normal Neck: supple with FROM; no lymphadenopathy CV: RRR Lungs: clear to auscultation bilaterally Abd: soft; non-tender Skin: reveals no rash; warm and dry Psychological: alert and cooperative; normal mood and affect  No Known Allergies  Past  Medical History:  Diagnosis Date  . Headache(784.0)   . Hypertension    Social History   Socioeconomic History  . Marital status: Single    Spouse name: Not on file  . Number of children: Not on file  . Years of education: Not on file  . Highest education level: Not on file  Occupational History  . Not on file  Social Needs  . Financial resource strain: Not on file  . Food insecurity    Worry: Not on file    Inability: Not on file  . Transportation needs    Medical: Not on file    Non-medical: Not on file  Tobacco Use  . Smoking status: Never Smoker  . Smokeless tobacco: Never Used  Substance and Sexual Activity  . Alcohol use: No  . Drug use: No  . Sexual activity: Yes    Birth control/protection: Condom, Surgical  Lifestyle  . Physical activity    Days per week: Not on file    Minutes per session: Not on file  . Stress: Not on file  Relationships  . Social Herbalist on phone: Not on file    Gets together: Not on file    Attends religious service: Not on file    Active member of club or organization: Not on file    Attends meetings of clubs or organizations: Not on file    Relationship status: Not on file  . Intimate partner violence    Fear of current or ex partner: Not on file    Emotionally abused: Not on file  Physically abused: Not on file    Forced sexual activity: Not on file  Other Topics Concern  . Not on file  Social History Narrative  . Not on file   Family History  Problem Relation Age of Onset  . Asthma Maternal Grandmother   . COPD Maternal Dulce SellarGrandmother           Laguana Desautel, MD 12/13/18 1039

## 2018-12-13 NOTE — Discharge Instructions (Signed)
You may use over the counter ibuprofen or acetaminophen as needed.  For a sore throat, over the counter products such as Colgate Peroxyl Mouth Sore Rinse or Chloraseptic Sore Throat Spray may provide some temporary relief. Your rapid strep test was negative today. We have sent your throat swab for culture and will let you know of any positive results. 

## 2018-12-14 LAB — CULTURE, GROUP A STREP (THRC)

## 2018-12-15 ENCOUNTER — Telehealth (HOSPITAL_COMMUNITY): Payer: Self-pay | Admitting: Emergency Medicine

## 2018-12-15 MED ORDER — PENICILLIN V POTASSIUM 500 MG PO TABS: 500.0000 mg | ORAL_TABLET | Freq: Two times a day (BID) | ORAL | 0 refills | Status: AC

## 2018-12-15 NOTE — Telephone Encounter (Signed)
Culture is positive for group A Strep germ.  Prescription for penicillin V 500mg bid x 10d #20 no refills sent to the pharmacy of record. Pt contacted and made aware. Recheck for further evaluation if symptoms are not improving.  Verbalized understanding.   

## 2020-11-08 ENCOUNTER — Encounter (HOSPITAL_COMMUNITY): Payer: Self-pay | Admitting: Emergency Medicine

## 2020-11-08 ENCOUNTER — Emergency Department (HOSPITAL_COMMUNITY)
Admission: EM | Admit: 2020-11-08 | Discharge: 2020-11-08 | Disposition: A | Discharge status: Home / Self Care | Payer: Medicaid - Out of State | Attending: Emergency Medicine | Admitting: Emergency Medicine

## 2020-11-08 ENCOUNTER — Other Ambulatory Visit: Payer: Self-pay

## 2020-11-08 ENCOUNTER — Emergency Department (HOSPITAL_COMMUNITY): Payer: Medicaid - Out of State

## 2020-11-08 DIAGNOSIS — I10 Essential (primary) hypertension: Secondary | ICD-10-CM | POA: Diagnosis not present

## 2020-11-08 DIAGNOSIS — U071 COVID-19: Secondary | ICD-10-CM | POA: Insufficient documentation

## 2020-11-08 DIAGNOSIS — Z28310 Unvaccinated for covid-19: Secondary | ICD-10-CM | POA: Insufficient documentation

## 2020-11-08 DIAGNOSIS — R0602 Shortness of breath: Secondary | ICD-10-CM | POA: Diagnosis present

## 2020-11-08 DIAGNOSIS — R079 Chest pain, unspecified: Secondary | ICD-10-CM

## 2020-11-08 LAB — BASIC METABOLIC PANEL
Anion gap: 6 (ref 5–15)
BUN: 9 mg/dL (ref 6–20)
CO2: 27 mmol/L (ref 22–32)
Calcium: 8.4 mg/dL — ABNORMAL LOW (ref 8.9–10.3)
Chloride: 105 mmol/L (ref 98–111)
Creatinine, Ser: 0.99 mg/dL (ref 0.44–1.00)
GFR, Estimated: >60 mL/min (ref >60)
Glucose, Bld: 93 mg/dL (ref 70–99)
Potassium: 3.3 mmol/L — ABNORMAL LOW (ref 3.5–5.1)
Sodium: 138 mmol/L (ref 135–145)

## 2020-11-08 LAB — CBC
HCT: 37.6 % (ref 36.0–46.0)
Hemoglobin: 12.6 g/dL (ref 12.0–15.0)
MCH: 31 pg (ref 26.0–34.0)
MCHC: 33.5 g/dL (ref 30.0–36.0)
MCV: 92.4 fL (ref 80.0–100.0)
Platelets: 330 10*3/uL (ref 150–400)
RBC: 4.07 MIL/uL (ref 3.87–5.11)
RDW: 12.6 % (ref 11.5–15.5)
WBC: 6.1 10*3/uL (ref 4.0–10.5)
nRBC: 0 % (ref 0.0–0.2)

## 2020-11-08 LAB — TROPONIN I (HIGH SENSITIVITY): Troponin I (High Sensitivity): <2 ng/L (ref <18)

## 2020-11-08 LAB — POC SARS CORONAVIRUS 2 AG -  ED: SARSCOV2ONAVIRUS 2 AG: POSITIVE — AB

## 2020-11-08 MED ORDER — KETOROLAC TROMETHAMINE 15 MG/ML IJ SOLN
15.0000 mg | Freq: Once | INTRAMUSCULAR | Status: AC
  Administered 2020-11-08: 15 mg via INTRAVENOUS
  Filled 2020-11-08: qty 1

## 2020-11-08 MED ORDER — SODIUM CHLORIDE 0.9 % IV BOLUS
1000.0000 mL | Freq: Once | INTRAVENOUS | Status: AC
  Administered 2020-11-08: 1000 mL via INTRAVENOUS

## 2020-11-08 MED ORDER — POTASSIUM CHLORIDE CRYS ER 20 MEQ PO TBCR
40.0000 meq | EXTENDED_RELEASE_TABLET | Freq: Once | ORAL | Status: AC
  Administered 2020-11-08: 40 meq via ORAL
  Filled 2020-11-08: qty 2

## 2020-11-08 NOTE — ED Provider Notes (Signed)
[facility] EMERGENCY DEPARTMENT Provider Note   CSN: [id number] Arrival date & time: 11/08/20  0645     History Chief Complaint  Patient presents with   Chest Pain    Kendra [name] is a 30 y.o. female.  HPI 30 year old female is presenting with chest pain and shortness of breath.  She started off with a cough yesterday.  At 430 this morning she woke up and had chest pressure and shortness of breath and the cough was worse.  She could not catch her breath.  The cough is nonproductive.  No fevers or leg swelling.  She is not having a sore throat.  She is having a headache as well as pressure in her chest.  She has not received any previous COVID vaccines.  Took some allergy medicine for yesterday but it did not seem to help.  Past Medical History:  Diagnosis Date   Headache(784.0)    Hypertension     Patient Active Problem List   Diagnosis Date Noted   History of failed BTL 09/27/2016   Late prenatal care 08/10/2016   Chronic hypertension during pregnancy, antepartum 08/10/2016   Supervision of high risk pregnancy, antepartum 06/14/2016    Past Surgical History:  Procedure Laterality Date   CHOLECYSTECTOMY     nexplanon Left 07/03/2012   [id number]   TUBAL LIGATION  07/2010   got pregnant in 2013 despite tubal so inserted nexplanon 06/2012   WISDOM TOOTH EXTRACTION       OB History     Gravida  5   Para  4   Term  4   Preterm  0   AB  1   Living  4      SAB  1   IAB  0   Ectopic  0   Multiple  0   Live Births  4           Family History  Problem Relation Age of Onset   Asthma Maternal Grandmother    COPD Maternal Grandmother     Social History   Tobacco Use   Smoking status: Never   Smokeless tobacco: Never  Vaping Use   Vaping Use: Never used  Substance Use Topics   Alcohol use: No   Drug use: No    Home Medications Prior to Admission medications   Medication Sig Start Date End Date Taking? Authorizing Provider   amoxicillin (AMOXIL) 875 MG tablet Take 1 tablet (875 mg total) by mouth 2 (two) times daily. 08/13/17   Elvina Sidle, MD    Allergies    Patient has no known allergies.  Review of Systems   Review of Systems  Constitutional:  Negative for fever.  HENT:  Negative for congestion and sore throat.   Respiratory:  Positive for cough and shortness of breath.   Cardiovascular:  Positive for chest pain. Negative for leg swelling.  Neurological:  Positive for headaches.  All other systems reviewed and are negative.  Physical Exam Updated Vital Signs BP 128/75   Pulse 61   Temp 99.2 F (37.3 C) (Oral)   Resp (!) 21   Ht 5\' 7"  (1.702 m)   Wt 129.3 kg   LMP 11/06/2020 (Exact Date)   SpO2 99%   BMI 44.64 kg/m   Physical Exam Vitals and nursing note reviewed.  Constitutional:      General: She is not in acute distress.    Appearance: She is well-developed. She is obese. She is not ill-appearing or diaphoretic.  HENT:     Head: Normocephalic and atraumatic.     Right Ear: External ear normal.     Left Ear: External ear normal.     Nose: Nose normal.  Eyes:     General:        Right eye: No discharge.        Left eye: No discharge.  Cardiovascular:     Rate and Rhythm: Normal rate and regular rhythm.     Heart sounds: Normal heart sounds.  Pulmonary:     Effort: Pulmonary effort is normal.     Breath sounds: Normal breath sounds. No decreased breath sounds, wheezing, rhonchi or rales.  Chest:     Chest wall: Tenderness (mild soreness in mid-chest) present.  Abdominal:     Palpations: Abdomen is soft.     Tenderness: There is no abdominal tenderness.  Musculoskeletal:     Right lower leg: No edema.     Left lower leg: No edema.  Skin:    General: Skin is warm and dry.  Neurological:     Mental Status: She is alert.  Psychiatric:        Mood and Affect: Mood is not anxious.    ED Results / Procedures / Treatments   Labs (all labs ordered are listed, but only  abnormal results are displayed) Labs Reviewed  BASIC METABOLIC PANEL - Abnormal; Notable for the following components:      Result Value   Potassium 3.3 (*)    Calcium 8.4 (*)    All other components within normal limits  POC SARS CORONAVIRUS 2 AG -  ED - Abnormal; Notable for the following components:   SARSCOV2ONAVIRUS 2 AG POSITIVE (*)    All other components within normal limits  CBC  POC URINE PREG, ED  TROPONIN I (HIGH SENSITIVITY)    EKG EKG Interpretation  Date/Time:  Tuesday November 08 2020 07:18:47 EDT Ventricular Rate:  73 PR Interval:  167 QRS Duration: 92 QT Interval:  391 QTC Calculation: 431 R Axis:   85 Text Interpretation: Sinus rhythm no acute ST/T changes similar to 2017 Confirmed by Pricilla Loveless [phone]) on 11/08/2020 7:27:54 AM  Radiology DG Chest Port 1 View  Result Date: 11/08/2020 CLINICAL DATA:  30 year old female with shortness of breath and cough since yesterday. EXAM: PORTABLE CHEST 1 VIEW COMPARISON:  Chest radiographs 05/04/2010. FINDINGS: Portable AP semi upright view at 0730 hours. Lung volumes and mediastinal contours remain normal. Visualized tracheal air column is within normal limits. Allowing for portable technique the lungs are clear. No pneumothorax or pleural effusion. Stable subtle dextroconvex thoracic scoliosis, no other osseous abnormality identified. IMPRESSION: Negative portable chest. Electronically Signed   By: Odessa Fleming M.D.   On: 11/08/2020 07:55    Procedures Procedures   Medications Ordered in ED Medications  sodium chloride 0.9 % bolus 1,000 mL (1,000 mLs Intravenous New Bag/Given 11/08/20 0803)  ketorolac (TORADOL) 15 MG/ML injection 15 mg (15 mg Intravenous Given 11/08/20 0803)  potassium chloride SA (KLOR-CON) CR tablet 40 mEq (40 mEq Oral Given 11/08/20 0174)    ED Course  I have reviewed the triage vital signs and the nursing notes.  Pertinent labs & imaging results that were available during my care of the patient were  reviewed by me and considered in my medical decision making (see chart for details).    MDM Rules/Calculators/A&P  Patient's presentation seems to be due to COVID-19.  Her chest pain is atypical and it has been present for over 3 hours with a negative troponin and a benign ECG.  In the setting of cough and positive COVID test my suspicion of other acute chest pathology such as bacterial pneumonia, PE, myocarditis/pericarditis, ACS, dissection is all pretty low.  My suspicion is this is all from COVID and offered COVID-specific treatments but she declines.  She is otherwise well-appearing and appears stable for discharge home with return precautions. Final Clinical Impression(s) / ED Diagnoses Final diagnoses:  COVID-19  Nonspecific chest pain    Rx / DC Orders ED Discharge Orders     None        Pricilla Loveless, MD 11/08/20 [phone]

## 2020-11-08 NOTE — Discharge Instructions (Signed)
If you develop high fever, severe cough or cough with blood, trouble breathing, severe headache, chest pain, neck pain/stiffness, vomiting, or any other new/concerning symptoms then return to the ER for evaluation  °

## 2020-11-08 NOTE — ED Triage Notes (Signed)
Pt is complaining chest pain with shortness of breath that began this morning.  States she has a cough that  began yesterday.

## 2020-11-08 NOTE — ED Notes (Signed)
Date and time results received: 11/08/20 & 0825  Test: Covid Critical Value:  Positive  Name of Provider Notified: Dr Criss Alvine  Orders Received? Or Actions Taken?: Notified

## 2022-02-13 ENCOUNTER — Emergency Department (HOSPITAL_COMMUNITY)
Admission: EM | Admit: 2022-02-13 | Discharge: 2022-02-13 | Disposition: A | Discharge status: Home / Self Care | Payer: Medicaid - Out of State | Attending: Emergency Medicine | Admitting: Emergency Medicine

## 2022-02-13 ENCOUNTER — Encounter (HOSPITAL_COMMUNITY): Payer: Self-pay | Admitting: *Deleted

## 2022-02-13 ENCOUNTER — Emergency Department (HOSPITAL_COMMUNITY): Payer: Medicaid - Out of State

## 2022-02-13 DIAGNOSIS — R079 Chest pain, unspecified: Secondary | ICD-10-CM | POA: Insufficient documentation

## 2022-02-13 LAB — CBC
HCT: 39.7 % (ref 36.0–46.0)
Hemoglobin: 13.2 g/dL (ref 12.0–15.0)
MCH: 30.1 pg (ref 26.0–34.0)
MCHC: 33.2 g/dL (ref 30.0–36.0)
MCV: 90.4 fL (ref 80.0–100.0)
Platelets: 288 10*3/uL (ref 150–400)
RBC: 4.39 MIL/uL (ref 3.87–5.11)
RDW: 13.2 % (ref 11.5–15.5)
WBC: 6.7 10*3/uL (ref 4.0–10.5)
nRBC: 0 % (ref 0.0–0.2)

## 2022-02-13 LAB — BASIC METABOLIC PANEL
Anion gap: 6 (ref 5–15)
BUN: 7 mg/dL (ref 6–20)
CO2: 24 mmol/L (ref 22–32)
Calcium: 8.4 mg/dL — ABNORMAL LOW (ref 8.9–10.3)
Chloride: 106 mmol/L (ref 98–111)
Creatinine, Ser: 0.64 mg/dL (ref 0.44–1.00)
GFR, Estimated: >60 mL/min (ref >60)
Glucose, Bld: 95 mg/dL (ref 70–99)
Potassium: 3.6 mmol/L (ref 3.5–5.1)
Sodium: 136 mmol/L (ref 135–145)

## 2022-02-13 LAB — TROPONIN I (HIGH SENSITIVITY)
Troponin I (High Sensitivity): <2 ng/L (ref <18)
Troponin I (High Sensitivity): <2 ng/L (ref <18)

## 2022-02-13 LAB — D-DIMER, QUANTITATIVE: D-Dimer, Quant: <0.27 ug/mL-FEU (ref 0.00–0.50)

## 2022-02-13 LAB — POC URINE PREG, ED: Preg Test, Ur: NEGATIVE

## 2022-02-13 MED ORDER — NAPROXEN 500 MG PO TABS: 500.0000 mg | ORAL_TABLET | Freq: Two times a day (BID) | ORAL | 0 refills | Status: AC

## 2022-02-13 NOTE — ED Triage Notes (Signed)
Pt states she had mid sternal chest pain that started yesterday while mopping at work; pt states she has a sharp pain when she takes a breath

## 2022-02-13 NOTE — Discharge Instructions (Signed)
Your work-up today was reassuring.  Avoid heavy lifting twisting pulling or pushing for at least 1 week.  Follow-up with your primary care provider or return to the emergency department for any new or worsening symptoms.

## 2022-02-13 NOTE — ED Provider Notes (Signed)
[facility] EMERGENCY DEPARTMENT Provider Note   CSN: [id number] Arrival date & time: 02/13/22  0920     History  Chief Complaint  Patient presents with   Chest Pain    SANTA ABDELRAHMAN is a 31 y.o. female.   Chest Pain Associated symptoms: no cough, no dizziness, no fever, no headache, no nausea, no shortness of breath and no vomiting         SHANDIIN EISENBEIS is a 31 y.o. female who presents to the Emergency Department complaining of midsternal chest pain x1 day.  Pain began while mopping.  She describes a sharp stabbing pain associated with movement and deep breathing.  She denies any shortness of breath, cough, or recent illness.  She has not tried any medications for symptomatic relief.  No history of PE or DVT.   Home Medications Prior to Admission medications   Medication Sig Start Date End Date Taking? Authorizing Provider  amoxicillin (AMOXIL) 875 MG tablet Take 1 tablet (875 mg total) by mouth 2 (two) times daily. 08/13/17   Robyn Haber, MD      Allergies    Patient has no known allergies.    Review of Systems   Review of Systems  Constitutional:  Negative for chills and fever.  HENT:  Negative for congestion.   Respiratory:  Negative for cough and shortness of breath.   Cardiovascular:  Positive for chest pain.  Gastrointestinal:  Negative for diarrhea, nausea and vomiting.  Genitourinary:  Negative for difficulty urinating and flank pain.  Musculoskeletal:  Negative for arthralgias, myalgias and neck pain.  Skin:  Negative for rash.  Neurological:  Negative for dizziness and headaches.    Physical Exam Updated Vital Signs BP (!) 128/92 (BP Location: Right Arm)   Pulse 73   Temp 98.5 F (36.9 C) (Oral)   Resp 20   Ht 5\' 8"  (1.727 m)   Wt 132.5 kg   LMP 01/24/2022 (Approximate)   SpO2 99%   BMI 44.40 kg/m  Physical Exam Vitals and nursing note reviewed.  Constitutional:      General: She is not in acute distress.    Appearance: She is  well-developed. She is not ill-appearing or toxic-appearing.  Cardiovascular:     Rate and Rhythm: Normal rate and regular rhythm.     Pulses: Normal pulses.  Pulmonary:     Effort: Pulmonary effort is normal. No respiratory distress.  Chest:     Chest wall: Tenderness (Focal tenderness to palpation of the mid sternum.  No bony crepitus or skin changes.) present.  Abdominal:     General: There is no distension.     Palpations: Abdomen is soft.     Tenderness: There is no abdominal tenderness.  Musculoskeletal:        General: Normal range of motion.     Cervical back: Normal range of motion.  Lymphadenopathy:     Cervical: No cervical adenopathy.  Skin:    General: Skin is warm.     Capillary Refill: Capillary refill takes less than 2 seconds.     Findings: No bruising, erythema or rash.  Neurological:     General: No focal deficit present.     Mental Status: She is alert.     Sensory: No sensory deficit.     Motor: No weakness.     ED Results / Procedures / Treatments   Labs (all labs ordered are listed, but only abnormal results are displayed) Labs Reviewed  BASIC METABOLIC PANEL  CBC  POC URINE PREG, ED  TROPONIN I (HIGH SENSITIVITY)    EKG None  Radiology No results found.  Procedures Procedures    Medications Ordered in ED Medications - No data to display  ED Course/ Medical Decision Making/ A&P                           Medical Decision Making Patient here for evaluation of midsternal chest pain that occurred while mopping.  No known injury.  Pain is reproducible to palpation and deep breathing and movement.  No history of PE or DVT.  On exam, patient well-appearing nontoxic.  Pain is reproducible with palpation of the midsternal area.  There is no skin changes edema or crepitus.  Vital signs reassuring.    Differential would include but not limited to musculoskeletal injury, costochondritis, PE, ACS.  Her pain is reproducible with palpation.  I feel  this is probably musculoskeletal.  Will obtain labs, EKG and chest x-ray.  Amount and/or Complexity of Data Reviewed Labs: ordered.    Details: Labs interpreted by me, no evidence of leukocytosis, chemistry showed no derangement.  D-dimer unremarkable.  Urine pregnancy test is negative and troponin is less than 2. Radiology: ordered.    Details: Chest x-ray without acute cardiopulmonary process ECG/medicine tests: ordered.    Details: EKG shows normal sinus rhythm Discussion of management or test interpretation with external provider(s): Patient has reassuring work-up.  No indication of PE or ACS.  Symptoms felt to be musculoskeletal.  She is agreeable to symptomatic treatment and close outpatient follow-up if needed.  Return precautions were discussed.  Risk Prescription drug management.           Final Clinical Impression(s) / ED Diagnoses Final diagnoses:  Nonspecific chest pain    Rx / DC Orders ED Discharge Orders     None         Pauline Aus, PA-C 02/15/22 1611    Bethann Berkshire, MD 02/16/22 [phone]

## 2022-10-01 ENCOUNTER — Other Ambulatory Visit: Payer: Self-pay

## 2022-10-01 ENCOUNTER — Emergency Department (HOSPITAL_COMMUNITY)
Admission: EM | Admit: 2022-10-01 | Discharge: 2022-10-01 | Disposition: A | Discharge status: Home / Self Care | Payer: Medicaid - Out of State | Attending: Emergency Medicine | Admitting: Emergency Medicine

## 2022-10-01 ENCOUNTER — Encounter (HOSPITAL_COMMUNITY): Payer: Self-pay | Admitting: Emergency Medicine

## 2022-10-01 DIAGNOSIS — I10 Essential (primary) hypertension: Secondary | ICD-10-CM | POA: Insufficient documentation

## 2022-10-01 DIAGNOSIS — M549 Dorsalgia, unspecified: Secondary | ICD-10-CM | POA: Diagnosis present

## 2022-10-01 DIAGNOSIS — Z79899 Other long term (current) drug therapy: Secondary | ICD-10-CM | POA: Diagnosis not present

## 2022-10-01 DIAGNOSIS — R062 Wheezing: Secondary | ICD-10-CM | POA: Insufficient documentation

## 2022-10-01 DIAGNOSIS — M545 Low back pain, unspecified: Secondary | ICD-10-CM | POA: Insufficient documentation

## 2022-10-01 DIAGNOSIS — R1032 Left lower quadrant pain: Secondary | ICD-10-CM | POA: Insufficient documentation

## 2022-10-01 LAB — BASIC METABOLIC PANEL
Anion gap: 8 (ref 5–15)
BUN: 6 mg/dL (ref 6–20)
CO2: 25 mmol/L (ref 22–32)
Calcium: 8.8 mg/dL — ABNORMAL LOW (ref 8.9–10.3)
Chloride: 102 mmol/L (ref 98–111)
Creatinine, Ser: 0.71 mg/dL (ref 0.44–1.00)
GFR, Estimated: >60 mL/min (ref >60)
Glucose, Bld: 95 mg/dL (ref 70–99)
Potassium: 3.3 mmol/L — ABNORMAL LOW (ref 3.5–5.1)
Sodium: 135 mmol/L (ref 135–145)

## 2022-10-01 LAB — URINALYSIS, ROUTINE W REFLEX MICROSCOPIC
Bilirubin Urine: NEGATIVE
Glucose, UA: NEGATIVE mg/dL
Hgb urine dipstick: NEGATIVE
Ketones, ur: NEGATIVE mg/dL
Leukocytes,Ua: NEGATIVE
Nitrite: NEGATIVE
Protein, ur: NEGATIVE mg/dL
Specific Gravity, Urine: 1.021 (ref 1.005–1.030)
pH: 6 (ref 5.0–8.0)

## 2022-10-01 LAB — CBC
HCT: 39 % (ref 36.0–46.0)
Hemoglobin: 13 g/dL (ref 12.0–15.0)
MCH: 29.9 pg (ref 26.0–34.0)
MCHC: 33.3 g/dL (ref 30.0–36.0)
MCV: 89.7 fL (ref 80.0–100.0)
Platelets: 331 10*3/uL (ref 150–400)
RBC: 4.35 MIL/uL (ref 3.87–5.11)
RDW: 12.7 % (ref 11.5–15.5)
WBC: 9.1 10*3/uL (ref 4.0–10.5)
nRBC: 0 % (ref 0.0–0.2)

## 2022-10-01 MED ORDER — METHOCARBAMOL 500 MG PO TABS: 500.0000 mg | ORAL_TABLET | Freq: Three times a day (TID) | ORAL | 0 refills | Status: AC | PRN

## 2022-10-01 MED ORDER — KETOROLAC TROMETHAMINE 15 MG/ML IJ SOLN
15.0000 mg | Freq: Once | INTRAMUSCULAR | Status: AC
  Administered 2022-10-01: 15 mg via INTRAMUSCULAR
  Filled 2022-10-01: qty 1

## 2022-10-01 NOTE — ED Triage Notes (Addendum)
Pt reports sharp left flank pain since Friday with radiation to LLQ abdomen. She recently had tailbone pain which resolved but this started a few days later with no apparent cause. Pt feels relief when wearing a girdle. No urinary symptoms reported but pt states she started drinking cranberry juice over the weekend just in case. Pain 10/10 left flank constant

## 2022-10-01 NOTE — ED Provider Notes (Signed)
[place] EMERGENCY DEPARTMENT AT [facility] Provider Note   CSN: [id number] Arrival date & time: 10/01/22  1645     History  No chief complaint on file.   Kendra [name] is a 32 y.o. female.  HPI Patient presents with lef abdominal/flank back pain.  Began a few days ago.  Had been running around after her son and does not know if she heard something then.  Cannot necessarily find a comfortable position but does not have stabbing pain.  No dysuria.  States feels better wearing a girdle.  No fevers.  States she is currently starting her menses.   Past Medical History:  Diagnosis Date   Headache(784.0)    Hypertension     Home Medications Prior to Admission medications   Medication Sig Start Date End Date Taking? Authorizing Provider  methocarbamol (ROBAXIN) 500 MG tablet Take 1 tablet (500 mg total) by mouth every 8 (eight) hours as needed for muscle spasms. 10/01/22  Yes Benjiman Core, MD  amoxicillin (AMOXIL) 875 MG tablet Take 1 tablet (875 mg total) by mouth 2 (two) times daily. 08/13/17   Elvina Sidle, MD  naproxen (NAPROSYN) 500 MG tablet Take 1 tablet (500 mg total) by mouth 2 (two) times daily. Take with food 02/13/22   Triplett, Tammy, PA-C      Allergies    Patient has no known allergies.    Review of Systems   Review of Systems  Physical Exam Updated Vital Signs BP (!) 142/92 (BP Location: Right Arm)   Pulse 74   Temp 98.8 F (37.1 C) (Oral)   Resp 16   Ht 5\' 8"  (1.727 m)   Wt 132.5 kg   LMP 09/20/2022 (Approximate)   SpO2 100%   BMI 44.40 kg/m  Physical Exam Vitals and nursing note reviewed.  HENT:     Head: Atraumatic.  Cardiovascular:     Rate and Rhythm: Regular rhythm.  Pulmonary:     Breath sounds: Wheezing present.  Abdominal:     Tenderness: There is abdominal tenderness.     Comments: Mild left lower quadrant tenderness without rebound or guarding.  No hernia palpated.  Musculoskeletal:        General: Tenderness  present.     Comments: Mild lumbar tenderness.  No rash.  Skin:    Capillary Refill: Capillary refill takes less than 2 seconds.  Neurological:     Mental Status: She is alert and oriented to person, place, and time.     ED Results / Procedures / Treatments   Labs (all labs ordered are listed, but only abnormal results are displayed) Labs Reviewed  BASIC METABOLIC PANEL - Abnormal; Notable for the following components:      Result Value   Potassium 3.3 (*)    Calcium 8.8 (*)    All other components within normal limits  URINALYSIS, ROUTINE W REFLEX MICROSCOPIC  CBC  POC URINE PREG, ED    EKG None  Radiology No results found.  Procedures Procedures    Medications Ordered in ED Medications  ketorolac (TORADOL) 15 MG/ML injection 15 mg (has no administration in time range)    ED Course/ Medical Decision Making/ A&P                             Medical Decision Making Amount and/or Complexity of Data Reviewed Labs: ordered.  Risk Prescription drug management.   Patient with left lower quadrant/back pain.  No dysuria.  Differential diagnoses long but includes infection, stone, diverticulitis.  No urinary symptoms.  However will check urinalysis and some basic blood work.  Blood work is reassuring.  Urine does not show infection.  White count normal.  Doubt severe intra-abdominal infection.  Severe pelvic pathology felt less likely.  Does have mild tenderness.  Will treat symptomatically with Toradol here and muscle laxer for home.  Work note given.  Return for worsening symptoms.        Final Clinical Impression(s) / ED Diagnoses Final diagnoses:  Acute midline low back pain without sciatica    Rx / DC Orders ED Discharge Orders          Ordered    methocarbamol (ROBAXIN) 500 MG tablet  Every 8 hours PRN        10/01/22 2029              Benjiman Core, MD 10/01/22 2030

## 2023-01-22 ENCOUNTER — Other Ambulatory Visit: Payer: Self-pay

## 2023-01-22 ENCOUNTER — Emergency Department (HOSPITAL_COMMUNITY)
Admission: EM | Admit: 2023-01-22 | Discharge: 2023-01-22 | Disposition: A | Discharge status: Home / Self Care | Payer: Medicaid - Out of State | Attending: Emergency Medicine | Admitting: Emergency Medicine

## 2023-01-22 ENCOUNTER — Encounter (HOSPITAL_COMMUNITY): Payer: Self-pay

## 2023-01-22 DIAGNOSIS — M546 Pain in thoracic spine: Secondary | ICD-10-CM | POA: Diagnosis present

## 2023-01-22 DIAGNOSIS — M549 Dorsalgia, unspecified: Secondary | ICD-10-CM

## 2023-01-22 DIAGNOSIS — I10 Essential (primary) hypertension: Secondary | ICD-10-CM | POA: Diagnosis not present

## 2023-01-22 MED ORDER — KETOROLAC TROMETHAMINE 30 MG/ML IJ SOLN
30.0000 mg | Freq: Once | INTRAMUSCULAR | Status: AC
  Administered 2023-01-22: 30 mg via INTRAMUSCULAR
  Filled 2023-01-22: qty 1

## 2023-01-22 MED ORDER — PREDNISONE 20 MG PO TABS: 40.0000 mg | ORAL_TABLET | Freq: Every day | ORAL | 0 refills | Status: AC

## 2023-01-22 NOTE — ED Provider Notes (Signed)
EMERGENCY DEPARTMENT AT [facility] Provider Note   CSN: [id number] Arrival date & time: 01/22/23  1047     History  Chief Complaint  Patient presents with   Back Pain    Kendra [name] is a 32 y.o. female with history of hypertension, who presents the emergency department complaining of left upper back pain for the past 3 days.  Patient states that she sat on the bleachers at her son's football game 4 days ago.  She woke up the next day with pain in the left upper back that often wraps around to the left shoulder.  Intermittently gives her some left hand numbness.  She has not been able to work due to the pain.  She states her symptoms are worse with laying flat or moving the left arm.  She states she has had similar pain in the past in her lower back, but does not remember what was done for her at that time.  She has tried some leftover muscle relaxers, over-the-counter medications, patches, heating pad, all without relief.  Denies any associated symptoms such as chest pain, shortness of breath, rashes, dysuria, hematuria, urine or bowel incontinence, recent illness.   Back Pain      Home Medications Prior to Admission medications   Medication Sig Start Date End Date Taking? Authorizing Provider  predniSONE (DELTASONE) 20 MG tablet Take 2 tablets (40 mg total) by mouth daily for 5 days. 01/22/23 01/27/23 Yes Dachelle Molzahn T, PA-C  amoxicillin (AMOXIL) 875 MG tablet Take 1 tablet (875 mg total) by mouth 2 (two) times daily. 08/13/17   Elvina Sidle, MD  methocarbamol (ROBAXIN) 500 MG tablet Take 1 tablet (500 mg total) by mouth every 8 (eight) hours as needed for muscle spasms. 10/01/22   Benjiman Core, MD  naproxen (NAPROSYN) 500 MG tablet Take 1 tablet (500 mg total) by mouth 2 (two) times daily. Take with food 02/13/22   Triplett, Tammy, PA-C      Allergies    Patient has no known allergies.    Review of Systems   Review of Systems   Musculoskeletal:  Positive for back pain.  All other systems reviewed and are negative.   Physical Exam Updated Vital Signs BP 110/68   Pulse 70   Temp 98 F (36.7 C)   Resp 16   Ht 5\' 8"  (1.727 m)   Wt 131.5 kg   LMP 01/08/2023   SpO2 100%   BMI 44.09 kg/m  Physical Exam Vitals and nursing note reviewed.  Constitutional:      Appearance: Normal appearance.  HENT:     Head: Normocephalic and atraumatic.  Eyes:     Conjunctiva/sclera: Conjunctivae normal.  Pulmonary:     Effort: Pulmonary effort is normal. No respiratory distress.  Musculoskeletal:       Arms:     Comments: Reproducible pain to palpation of left upper back, muscle knot palpated, pain reproducible also with left arm abduction and flexion.  Normal grip strength bilaterally, normal sensation  Skin:    General: Skin is warm and dry.  Neurological:     Mental Status: She is alert.  Psychiatric:        Mood and Affect: Mood normal.        Behavior: Behavior normal.     ED Results / Procedures / Treatments   Labs (all labs ordered are listed, but only abnormal results are displayed) Labs Reviewed - No data to display  EKG None  Radiology No results found.  Procedures Procedures    Medications Ordered in ED Medications  ketorolac (TORADOL) 30 MG/ML injection 30 mg (30 mg Intramuscular Given 01/22/23 1412)    ED Course/ Medical Decision Making/ A&P                                 Medical Decision Making Risk Prescription drug management.   This patient is a 32 y.o. female  who presents to the ED for concern of left upper back pain.   Differential diagnoses prior to evaluation: The emergent differential diagnosis includes, but is not limited to,  Fracture (acute/chronic), muscle strain, cauda equina / myelopathy, spinal stenosis, DDD, ligamentous injury, disk herniation, radiculopathy, ACS, PE, shingles. This is not an exhaustive differential.   Past Medical History / Co-morbidities /  Social History: HTN  Additional history: Chart reviewed. Pertinent results include: Reviewed ER visit note from June  Physical Exam: Physical exam performed. The pertinent findings include: Normal vital signs, no acute distress.  Pain reproducible to palpation of the left upper back just medial to the scapula, also reproducible with left shoulder abduction and flexion.  Neurovascularly intact in bilateral upper extremities.  Lab Tests/Imaging studies: As patient had no injury to the area, very low concern for acute fractures or dislocations, will defer imaging at this time.  Medications: I ordered medication including Toradol.  I have reviewed the patients home medicines and have made adjustments as needed.   Disposition: After consideration of the diagnostic results and the patients response to treatment, I feel that emergency department workup does not suggest an emergent condition requiring admission or immediate intervention beyond what has been performed at this time. The plan is: Discharge home with symptomatic treatment of likely thoracic muscle strain.,  Exam is reassuring, and all symptoms are reproducible.  Patient's had no chest pain, shortness of breath, leg pain or swelling, and I have low concern for ACS or PE.  Will give short course of steroid medication, and recommend continuing OTC meds. No red flag symptoms to raise concern for emergent spine pathology such as epidural abscess or myelopathy. The patient is safe for discharge and has been instructed to return immediately for worsening symptoms, change in symptoms or any other concerns.  Final Clinical Impression(s) / ED Diagnoses Final diagnoses:  Upper back pain on left side    Rx / DC Orders ED Discharge Orders          Ordered    predniSONE (DELTASONE) 20 MG tablet  Daily        01/22/23 1415           Portions of this report may have been transcribed using voice recognition software. Every effort was made to  ensure accuracy; however, inadvertent computerized transcription errors may be present.    Jeanella Flattery 01/22/23 1536    Terrilee Files, MD 01/22/23 [phone]

## 2023-01-22 NOTE — ED Triage Notes (Signed)
Pt arrives ambulatory to ED with c/o pain to left back/rib area. States it started after sitting a football game, no known injury. Pt states that she has been taking tylenol, ibuprofen, using heating pads, and back brace with no relief. Pain is worse with movement.

## 2023-01-22 NOTE — Discharge Instructions (Signed)
You were seen in the emergency department today for back pain.   I suspect this is likely caused by muscle strain vs muscle knot in your upper back.  Rest, gentle exercise and stretching will aid in recovery from any injuries to your back.  Using medication such as Tylenol and ibuprofen will help alleviate pain as well as decrease swelling and inflammation associated with these injuries. You may use 600 mg ibuprofen every 6 hours or 1000 mg of Tylenol every 6 hours.  You may choose to alternate between the 2.  This would be most effective.  Not to exceed 4 g of Tylenol within 24 hours.  Not to exceed 3200 mg ibuprofen 24 hours.  I have prescribed you: a course of steroids.  Salt water/Epson salt soaks, massage, icy hot/Biofreeze/BenGay and other similar products can help with symptoms.  Please return to the emergency department for reevaluation if you denies any new or concerning symptoms such as fever, new numbness or weakness in your legs, difficulty using the restroom or incontinence.

## 2024-02-10 ENCOUNTER — Emergency Department (HOSPITAL_COMMUNITY)
Admission: EM | Admit: 2024-02-10 | Discharge: 2024-02-10 | Disposition: A | Discharge status: Home / Self Care | Attending: Emergency Medicine | Admitting: Emergency Medicine

## 2024-02-10 ENCOUNTER — Other Ambulatory Visit: Payer: Self-pay

## 2024-02-10 ENCOUNTER — Encounter (HOSPITAL_COMMUNITY): Payer: Self-pay

## 2024-02-10 ENCOUNTER — Emergency Department (HOSPITAL_COMMUNITY)

## 2024-02-10 DIAGNOSIS — O219 Vomiting of pregnancy, unspecified: Secondary | ICD-10-CM | POA: Diagnosis present

## 2024-02-10 DIAGNOSIS — I10 Essential (primary) hypertension: Secondary | ICD-10-CM | POA: Insufficient documentation

## 2024-02-10 LAB — URINALYSIS, ROUTINE W REFLEX MICROSCOPIC
Bilirubin Urine: NEGATIVE
Glucose, UA: NEGATIVE mg/dL
Hgb urine dipstick: NEGATIVE
Ketones, ur: NEGATIVE mg/dL
Leukocytes,Ua: NEGATIVE
Nitrite: NEGATIVE
Protein, ur: NEGATIVE mg/dL
Specific Gravity, Urine: 1.023 (ref 1.005–1.030)
pH: 5 (ref 5.0–8.0)

## 2024-02-10 LAB — CBC WITH DIFFERENTIAL/PLATELET
Abs Immature Granulocytes: 0.02 K/uL (ref 0.00–0.07)
Basophils Absolute: 0.1 K/uL (ref 0.0–0.1)
Basophils Relative: 1 %
Eosinophils Absolute: 0.1 K/uL (ref 0.0–0.5)
Eosinophils Relative: 1 %
HCT: 38.8 % (ref 36.0–46.0)
Hemoglobin: 13 g/dL (ref 12.0–15.0)
Immature Granulocytes: 0 %
Lymphocytes Relative: 31 %
Lymphs Abs: 2.1 K/uL (ref 0.7–4.0)
MCH: 30 pg (ref 26.0–34.0)
MCHC: 33.5 g/dL (ref 30.0–36.0)
MCV: 89.4 fL (ref 80.0–100.0)
Monocytes Absolute: 0.3 K/uL (ref 0.1–1.0)
Monocytes Relative: 5 %
Neutro Abs: 4.2 K/uL (ref 1.7–7.7)
Neutrophils Relative %: 62 %
Platelets: 367 K/uL (ref 150–400)
RBC: 4.34 MIL/uL (ref 3.87–5.11)
RDW: 13.7 % (ref 11.5–15.5)
WBC: 6.8 K/uL (ref 4.0–10.5)
nRBC: 0 % (ref 0.0–0.2)

## 2024-02-10 LAB — COMPREHENSIVE METABOLIC PANEL WITH GFR
ALT: 12 U/L (ref 0–44)
AST: 15 U/L (ref 15–41)
Albumin: 4.1 g/dL (ref 3.5–5.0)
Alkaline Phosphatase: 58 U/L (ref 38–126)
Anion gap: 11 (ref 5–15)
BUN: 6 mg/dL (ref 6–20)
CO2: 23 mmol/L (ref 22–32)
Calcium: 9.2 mg/dL (ref 8.9–10.3)
Chloride: 106 mmol/L (ref 98–111)
Creatinine, Ser: 0.68 mg/dL (ref 0.44–1.00)
GFR, Estimated: >60 mL/min (ref >60)
Glucose, Bld: 87 mg/dL (ref 70–99)
Potassium: 4 mmol/L (ref 3.5–5.1)
Sodium: 139 mmol/L (ref 135–145)
Total Bilirubin: 0.7 mg/dL (ref 0.0–1.2)
Total Protein: 8.5 g/dL — ABNORMAL HIGH (ref 6.5–8.1)

## 2024-02-10 LAB — HCG, QUANTITATIVE, PREGNANCY: hCG, Beta Chain, Quant, S: 16875 m[IU]/mL — ABNORMAL HIGH (ref <5)

## 2024-02-10 LAB — LIPASE, BLOOD: Lipase: 20 U/L (ref 11–51)

## 2024-02-10 LAB — POC URINE PREG, ED: Preg Test, Ur: POSITIVE — AB

## 2024-02-10 MED ORDER — DOXYLAMINE-PYRIDOXINE 10-10 MG PO TBEC: 2.0000 | DELAYED_RELEASE_TABLET | Freq: Every evening | ORAL | 0 refills | Status: AC | PRN

## 2024-02-10 MED ORDER — PYRIDOXINE HCL 25 MG PO TABS
25.0000 mg | ORAL_TABLET | Freq: Once | ORAL | Status: AC
  Administered 2024-02-10: 25 mg via ORAL
  Filled 2024-02-10: qty 1

## 2024-02-10 NOTE — ED Notes (Signed)
 Patient refused to allow her IV to be removed. Security notified.

## 2024-02-10 NOTE — ED Provider Notes (Signed)
 [place] EMERGENCY DEPARTMENT AT [facility] Provider Note   CSN: [id number] Arrival date & time: 02/10/24  9077     Patient presents with: Emesis   Kendra [name] is a 33 y.o. female with history of hypertension, presents with concern for vomiting that started 3 days ago.  Reports she initially threw up the food she ate, but over the past 2 days, she has had an episode or 2 of vomiting where she will just randomly throw up yellow appearing liquid.  She also reports upper abdominal cramping when she vomits.  Denies any fever, chills, cough.  No dysuria, hematuria, increased frequency.  She reports she is around a lot of people at her work and is unsure of any sick contacts. She reports normal bowel movements with last bowel movement being this morning.    Emesis      Prior to Admission medications   Medication Sig Start Date End Date Taking? Authorizing Provider  Doxylamine-Pyridoxine 10-10 MG TBEC Take 2 tablets by mouth at bedtime as needed (nausea and vomiting). 02/10/24  Yes Veta Palma, PA-C  amoxicillin  (AMOXIL ) 875 MG tablet Take 1 tablet (875 mg total) by mouth 2 (two) times daily. 08/13/17   Mario Million, MD  methocarbamol  (ROBAXIN ) 500 MG tablet Take 1 tablet (500 mg total) by mouth every 8 (eight) hours as needed for muscle spasms. 10/01/22   Patsey Lot, MD  naproxen  (NAPROSYN ) 500 MG tablet Take 1 tablet (500 mg total) by mouth 2 (two) times daily. Take with food 02/13/22   Triplett, Tammy, PA-C    Allergies: Patient has no known allergies.    Review of Systems  Gastrointestinal:  Positive for vomiting.    Updated Vital Signs BP (!) 108/58 (BP Location: Left Arm)   Pulse 67   Temp 97.8 F (36.6 C) (Oral)   Resp 17   Ht 5' 8 (1.727 m)   Wt 131.5 kg   LMP  (LMP Unknown)   SpO2 98%   BMI 44.08 kg/m   Physical Exam Vitals and nursing note reviewed.  Constitutional:      General: She is not in acute distress.    Appearance: She  is well-developed.     Comments: Well-appearing, no active vomiting  HENT:     Head: Normocephalic and atraumatic.     Mouth/Throat:     Pharynx: No oropharyngeal exudate or posterior oropharyngeal erythema.     Comments: Swallowing without difficulty Eyes:     Conjunctiva/sclera: Conjunctivae normal.  Cardiovascular:     Rate and Rhythm: Normal rate and regular rhythm.     Heart sounds: No murmur heard. Pulmonary:     Effort: Pulmonary effort is normal. No respiratory distress.     Breath sounds: Normal breath sounds.  Abdominal:     Palpations: Abdomen is soft.     Tenderness: There is abdominal tenderness.     Comments: Very mild epigastric tenderness to palpation without rebound or guarding  Musculoskeletal:        General: No swelling.     Cervical back: Neck supple.  Skin:    General: Skin is warm and dry.     Capillary Refill: Capillary refill takes less than 2 seconds.  Neurological:     Mental Status: She is alert.  Psychiatric:        Mood and Affect: Mood normal.     (all labs ordered are listed, but only abnormal results are displayed) Labs Reviewed  COMPREHENSIVE METABOLIC PANEL WITH GFR -  Abnormal; Notable for the following components:      Result Value   Total Protein 8.5 (*)    All other components within normal limits  URINALYSIS, ROUTINE W REFLEX MICROSCOPIC - Abnormal; Notable for the following components:   APPearance HAZY (*)    All other components within normal limits  HCG, QUANTITATIVE, PREGNANCY - Abnormal; Notable for the following components:   hCG, Beta Chain, Quant, S 16,875 (*)    All other components within normal limits  POC URINE PREG, ED - Abnormal; Notable for the following components:   Preg Test, Ur POSITIVE (*)    All other components within normal limits  LIPASE, BLOOD  CBC WITH DIFFERENTIAL/PLATELET    EKG: None  Radiology: US  OB Comp < 14 Wks Result Date: 02/10/2024 CLINICAL DATA:  Confirm intrauterine pregnancy. EXAM:  OBSTETRIC <14 WK US  AND TRANSVAGINAL OB US  TECHNIQUE: Both transabdominal and transvaginal ultrasound examinations were performed for complete evaluation of the gestation as well as the maternal uterus, adnexal regions, and pelvic cul-de-sac. Transvaginal technique was performed to assess early pregnancy. COMPARISON:  None Available. FINDINGS: Intrauterine gestational sac: Single. Yolk sac:  Present. Embryo:  Present. Cardiac Activity: Present. Heart Rate: 109 bpm CRL:  4.7 mm   6 w   1 d                  US  EDC: 10/04/2024. Subchorionic hemorrhage:  Small, measuring 1.7 x 0.6 x 1.5 cm. Maternal uterus/adnexae: None.  No free fluid. IMPRESSION: Single living intrauterine pregnancy with gestational age [redacted] weeks 1 day and estimated date of confinement 10/04/2024. Small subchorionic hemorrhage. Electronically Signed   By: Newell Eke M.D.   On: 02/10/2024 14:56   US  OB Transvaginal Result Date: 02/10/2024 CLINICAL DATA:  Confirm intrauterine pregnancy. EXAM: OBSTETRIC <14 WK US  AND TRANSVAGINAL OB US  TECHNIQUE: Both transabdominal and transvaginal ultrasound examinations were performed for complete evaluation of the gestation as well as the maternal uterus, adnexal regions, and pelvic cul-de-sac. Transvaginal technique was performed to assess early pregnancy. COMPARISON:  None Available. FINDINGS: Intrauterine gestational sac: Single. Yolk sac:  Present. Embryo:  Present. Cardiac Activity: Present. Heart Rate: 109 bpm CRL:  4.7 mm   6 w   1 d                  US  EDC: 10/04/2024. Subchorionic hemorrhage:  Small, measuring 1.7 x 0.6 x 1.5 cm. Maternal uterus/adnexae: None.  No free fluid. IMPRESSION: Single living intrauterine pregnancy with gestational age [redacted] weeks 1 day and estimated date of confinement 10/04/2024. Small subchorionic hemorrhage. Electronically Signed   By: Newell Eke M.D.   On: 02/10/2024 14:56     Procedures   Medications Ordered in the ED  pyridOXINE (VITAMIN B6) tablet 25 mg (25 mg  Oral Given 02/10/24 1036)                                    Medical Decision Making Amount and/or Complexity of Data Reviewed Labs: ordered. Radiology: ordered.  Risk OTC drugs. Prescription drug management.     Differential diagnosis includes but is not limited to Cannabinoid hyperemesis syndrome, acute cholecystitis, cholelithiasis, cholangitis, choledocholithiasis, peptic ulcer, gastritis, gastroenteritis, appendicitis, IBS, IBD, DKA, nephrolithiasis, UTI, pyelonephritis, pancreatitis, diverticulitis, mesenteric ischemia, abdominal aortic aneurysm, small bowel obstruction, volvulus, pregnancy related concerns in females of childbearing age    ED Course:  Upon initial evaluation, patient is  well-appearing, no acute distress.  Stable vitals.  Abdomen is soft with only very mild epigastric tenderness to palpation.  No rebound or guarding.  No active vomiting.  Labs Ordered: I Ordered, and personally interpreted labs.  The pertinent results include:   CBC within normal limits CMP within normal limits Lipase within normal limits Urine pregnancy positive.  Beta-hCG 16875 Urinalysis without signs of infection  Imaging Studies ordered: I ordered imaging studies including transvaginal ultrasound I independently visualized the imaging with scope of interpretation limited to determining acute life threatening conditions related to emergency care. Imaging showed  IMPRESSION:  Single living intrauterine pregnancy with gestational age [redacted] weeks 1  day and estimated date of confinement 10/04/2024. Small subchorionic  hemorrhage.   I agree with the radiologist interpretation   Medications Given: Pyridoxine   Upon re-evaluation, patient remains well-appearing stable vitals.  She was given pyridoxine here for her nausea.  She has been p.o. challenged with ginger ale and tolerated this well.  Her lab work is reassuring with CBC, CMP, lipase within normal limits.  Urinalysis without  signs of infection.  Her pregnancy test was positive, and transvaginal ultrasound shows a single intrauterine pregnancy estimated to be at 6 weeks and 1 day.  This would explain her nausea and vomiting.  She does not have any significant abdominal tenderness to palpation, labs reassuring, normal vitals, have low concern for other acute intra-abdominal pathology that would require further workup today.  Patient stable and appropriate for discharge home    Impression: Nausea vomiting pregnancy  Disposition:  The patient was discharged home with instructions to may take the doxylamine pyridoxine prescribed as needed for nausea and vomiting before bed.  She understands this could make her drowsy. Start taking a prenatal daily vitamin.  Establish care with a OB for further routine pregnancy care.  Return precautions given and patient verbalized understanding.      This chart was dictated using voice recognition software, Dragon. Despite the best efforts of this provider to proofread and correct errors, errors may still occur which can change documentation meaning.       Final diagnoses:  Nausea and vomiting in pregnancy    ED Discharge Orders          Ordered    Doxylamine-Pyridoxine 10-10 MG TBEC  At bedtime PRN        02/10/24 1512               Veta Palma, PA-C 02/10/24 1519    Towana Ozell BROCKS, MD 02/10/24 1737

## 2024-02-10 NOTE — Discharge Instructions (Addendum)
 Your pregnancy test was positive today.  The ultrasound shows that you are about 6 weeks and 1 day along. Your estimated due date is 10/05/23. There is a small subchorionic hemorrhage which could cause you to have some vaginal bleeding/spotting.   Please begin taking a pre-natal daily vitamin.   You have been prescribed a medication called doxylamine-pyridoxine to help with your nausea and vomiting.  You may take 2 tablets before bed as needed for nausea and vomiting.  This does have the potential to make you drowsy.  Your kidney, liver, pancreas labs are normal today.  Your blood counts and electrolytes are normal today.  Your urine did not show any signs of infection.  If you experience vaginal bleeding, begin passing clots, or have any other pregnancy related concerns, please go directly to: [facility] at [facility] [address] [place], [place], [place] [address]  You do not need an appointment.   Please establish care with a OBGYN. You can make an appointment to see a provider:   Center for [facility] at [facility]  [address]  [phone]   Center for [facility] at [facility]  [address] [employer]  [phone]   Center for [facility] at [facility] [address] [place]  [phone]   Center for [facility] at [facility]  [address]  [phone]   Center for [employer]  [address] [place]  [phone]   If you already have an established OB/GYN provider in the area, please make an appointment with them.    Please return to the ER for any other new or concerning symptoms

## 2024-02-10 NOTE — ED Notes (Signed)
 While walking by the patients bed in the hallway pt called this nurse a bitch.

## 2024-02-10 NOTE — ED Notes (Signed)
 NT went into room and attempted to move patient into a hallway bed d/t ED redsurg and needing more patient care rooms. Pt is awaiting results for ultrasound to be d/c home. Pt requested to leave instead of being moved. This CN in the room to explain to patient she does have the right to leave but her test results are not complete yet and she should wait for them. Patient stated she does not know why she has had to wait here since 9:30 am this morning. This nurse explained to the patient that lab work, Scans ETC take time and we would give her the results as soon as they were available e for the doctor to review. Pt yelled at this nurse saying  get our of my room because you have a attitude. Once again explained to the patient that she would have to be moved into the hall way and this nurse walked away from the room. As walking away patient called this nurse a bitch.

## 2024-02-10 NOTE — ED Notes (Signed)
 Patient making verbal threats towards staff stating she is going to beat their ass. Security notified of patient behavior.     02/10/24 1500  [place] Violence Checklist (Document every shift. If negative for 72 hrs, no longer required. Reassess if new symptoms are present)  Confusion 0  Irritable 1  Boisterous 1  Physical threats 1  Verbal threats 1  Attacking objects 0  Total Score 4  Violence Risk Category High Risk

## 2024-02-10 NOTE — ED Triage Notes (Signed)
 Pt arrived via POV c/o emesis since yesterday, and endorses abdominal cramping.

## 2024-03-04 ENCOUNTER — Other Ambulatory Visit: Payer: Self-pay | Admitting: Obstetrics & Gynecology

## 2024-03-04 DIAGNOSIS — O3680X0 Pregnancy with inconclusive fetal viability, not applicable or unspecified: Secondary | ICD-10-CM

## 2024-03-04 DIAGNOSIS — O09529 Supervision of elderly multigravida, unspecified trimester: Secondary | ICD-10-CM

## 2024-03-09 ENCOUNTER — Encounter: Payer: Self-pay | Admitting: Radiology

## 2024-03-09 ENCOUNTER — Ambulatory Visit: Admitting: Radiology

## 2024-03-09 DIAGNOSIS — O3680X0 Pregnancy with inconclusive fetal viability, not applicable or unspecified: Secondary | ICD-10-CM

## 2024-03-09 DIAGNOSIS — O09529 Supervision of elderly multigravida, unspecified trimester: Secondary | ICD-10-CM

## 2024-03-09 DIAGNOSIS — Z3A10 10 weeks gestation of pregnancy: Secondary | ICD-10-CM | POA: Diagnosis not present

## 2024-03-09 NOTE — Progress Notes (Signed)
 US : GA = 10+1 weeks Anteverted uterus with single viable early IUP.  GS intact within upper mid uterine cavity.  CRL = 33.7 mm = 10+2 weeks, Yolk Sac = 6 mm, FHR = 164 bpm, Nl ov's,  Neg adnexal regions - neg CDS - no free fluid present

## 2024-03-31 ENCOUNTER — Encounter: Payer: Self-pay | Admitting: Women's Health

## 2024-03-31 ENCOUNTER — Ambulatory Visit: Admitting: Women's Health

## 2024-03-31 ENCOUNTER — Other Ambulatory Visit (HOSPITAL_COMMUNITY)
Admission: RE | Admit: 2024-03-31 | Discharge: 2024-03-31 | Disposition: A | Source: Ambulatory Visit | Attending: Women's Health | Admitting: Women's Health

## 2024-03-31 ENCOUNTER — Encounter: Admitting: *Deleted

## 2024-03-31 VITALS — BP 141/83 | HR 115 | Wt 300.0 lb

## 2024-03-31 DIAGNOSIS — Z3A13 13 weeks gestation of pregnancy: Secondary | ICD-10-CM

## 2024-03-31 DIAGNOSIS — Z3481 Encounter for supervision of other normal pregnancy, first trimester: Secondary | ICD-10-CM

## 2024-03-31 DIAGNOSIS — O10919 Unspecified pre-existing hypertension complicating pregnancy, unspecified trimester: Secondary | ICD-10-CM

## 2024-03-31 DIAGNOSIS — Z3480 Encounter for supervision of other normal pregnancy, unspecified trimester: Secondary | ICD-10-CM

## 2024-03-31 DIAGNOSIS — O0990 Supervision of high risk pregnancy, unspecified, unspecified trimester: Secondary | ICD-10-CM | POA: Insufficient documentation

## 2024-03-31 DIAGNOSIS — O0991 Supervision of high risk pregnancy, unspecified, first trimester: Secondary | ICD-10-CM

## 2024-03-31 DIAGNOSIS — Z131 Encounter for screening for diabetes mellitus: Secondary | ICD-10-CM

## 2024-03-31 LAB — POCT URINALYSIS DIPSTICK OB
Blood, UA: NEGATIVE
Glucose, UA: NEGATIVE
Nitrite, UA: NEGATIVE
POC,PROTEIN,UA: NEGATIVE

## 2024-03-31 MED ORDER — ASPIRIN 81 MG PO TBEC: 162.0000 mg | DELAYED_RELEASE_TABLET | Freq: Every day | ORAL | 2 refills | Status: AC

## 2024-03-31 MED ORDER — BLOOD PRESSURE CUFF MISC: 1.0000 | 0 refills | Status: AC | PRN

## 2024-03-31 MED ORDER — LABETALOL HCL 200 MG PO TABS: 200.0000 mg | ORAL_TABLET | Freq: Two times a day (BID) | ORAL | 3 refills | Status: DC

## 2024-03-31 NOTE — Patient Instructions (Signed)
Kendra [name], thank you for choosing our office today! We appreciate the opportunity to meet your healthcare needs. You may receive a short survey by mail, e-mail, or through MyChart. If you are happy with your care we would appreciate if you could take just a few minutes to complete the survey questions. We read all of your comments and take your feedback very seriously. Thank you again for choosing our office.  [facility] ([address] [place], [place] [address]) Entrance C, located off of [address] Free 24/7 valet parking   Nausea & Vomiting Have saltine crackers or pretzels by your bed and eat a few bites before you raise your head out of bed in the morning Eat small frequent meals throughout the day instead of large meals Drink plenty of fluids throughout the day to stay hydrated, just don't drink a lot of fluids with your meals.  This can make your stomach fill up faster making you feel sick Do not brush your teeth right after you eat Products with real ginger are good for nausea, like ginger ale and ginger hard candy Make sure it says made with real ginger! Sucking on sour candy like lemon heads is also good for nausea If your prenatal vitamins make you nauseated, take them at night so you will sleep through the nausea Sea Bands If you feel like you need medicine for the nausea & vomiting please let us know If you are unable to keep any fluids or food down please let us know   Constipation Drink plenty of fluid, preferably water, throughout the day Eat foods high in fiber such as fruits, vegetables, and grains Exercise, such as walking, is a good way to keep your bowels regular Drink warm fluids, especially warm prune juice, or decaf coffee Eat a 1/2 cup of real oatmeal (not instant), 1/2 cup applesauce, and 1/2-1 cup warm prune juice every day If needed, you may take Colace (docusate sodium) stool softener  once or twice a day to help keep the stool soft.  If you still are having problems with constipation, you may take Miralax once daily as needed to help keep your bowels regular.   Home Blood Pressure Monitoring for Patients   Your provider has recommended that you check your blood pressure (BP) at least once a week at home. If you do not have a blood pressure cuff at home, one will be provided for you. Contact your provider if you have not received your monitor within 1 week.   Helpful Tips for Accurate Home Blood Pressure Checks  Don't smoke, exercise, or drink caffeine 30 minutes before checking your BP Use the restroom before checking your BP (a full bladder can raise your pressure) Relax in a comfortable upright chair Feet on the ground Left arm resting comfortably on a flat surface at the level of your heart Legs uncrossed Back supported Sit quietly and don't talk Place the cuff on your bare arm Adjust snuggly, so that only two fingertips can fit between your skin and the top of the cuff Check 2 readings separated by at least one minute Keep a log of your BP readings For a visual, please reference this diagram: http://ccnc.care/bpdiagram  Provider Name: [facility]     Phone: [phone]  Zone 1: ALL CLEAR  Continue to monitor your symptoms:  BP reading is less than 140 (top number) or less than 90 (bottom   number)  No right upper stomach pain No headaches or seeing spots No feeling nauseated or throwing up No swelling in face and hands  Zone 2: CAUTION Call your doctor's office for any of the following:  BP reading is greater than 140 (top number) or greater than 90 (bottom number)  Stomach pain under your ribs in the middle or right side Headaches or seeing spots Feeling nauseated or throwing up Swelling in face and hands  Zone 3: EMERGENCY  Seek immediate medical care if you have any of the following:  BP reading is greater than160 (top number) or greater than  110 (bottom number) Severe headaches not improving with Tylenol Serious difficulty catching your breath Any worsening symptoms from Zone 2    First Trimester of Pregnancy The first trimester of pregnancy is from week 1 until the end of week 12 (months 1 through 3). A week after a sperm fertilizes an egg, the egg will implant on the wall of the uterus. This embryo will begin to develop into a baby. Genes from you and your partner are forming the baby. The female genes determine whether the baby is a boy or a girl. At 6-8 weeks, the eyes and face are formed, and the heartbeat can be seen on ultrasound. At the end of 12 weeks, all the baby's organs are formed.  Now that you are pregnant, you will want to do everything you can to have a healthy baby. Two of the most important things are to get good prenatal care and to follow your health care provider's instructions. Prenatal care is all the medical care you receive before the baby's birth. This care will help prevent, find, and treat any problems during the pregnancy and childbirth. BODY CHANGES Your body goes through many changes during pregnancy. The changes vary from woman to woman.  You may gain or lose a couple of pounds at first. You may feel sick to your stomach (nauseous) and throw up (vomit). If the vomiting is uncontrollable, call your health care provider. You may tire easily. You may develop headaches that can be relieved by medicines approved by your health care provider. You may urinate more often. Painful urination may mean you have a bladder infection. You may develop heartburn as a result of your pregnancy. You may develop constipation because certain hormones are causing the muscles that push waste through your intestines to slow down. You may develop hemorrhoids or swollen, bulging veins (varicose veins). Your breasts may begin to grow larger and become tender. Your nipples may stick out more, and the tissue that surrounds them  (areola) may become darker. Your gums may bleed and may be sensitive to brushing and flossing. Dark spots or blotches (chloasma, mask of pregnancy) may develop on your face. This will likely fade after the baby is born. Your menstrual periods will stop. You may have a loss of appetite. You may develop cravings for certain kinds of food. You may have changes in your emotions from day to day, such as being excited to be pregnant or being concerned that something may go wrong with the pregnancy and baby. You may have more vivid and strange dreams. You may have changes in your hair. These can include thickening of your hair, rapid growth, and changes in texture. Some women also have hair loss during or after pregnancy, or hair that feels dry or thin. Your hair will most likely return to normal after your baby is born. WHAT TO EXPECT AT YOUR PRENATAL   VISITS During a routine prenatal visit: You will be weighed to make sure you and the baby are growing normally. Your blood pressure will be taken. Your abdomen will be measured to track your baby's growth. The fetal heartbeat will be listened to starting around week 10 or 12 of your pregnancy. Test results from any previous visits will be discussed. Your health care provider may ask you: How you are feeling. If you are feeling the baby move. If you have had any abnormal symptoms, such as leaking fluid, bleeding, severe headaches, or abdominal cramping. If you have any questions. Other tests that may be performed during your first trimester include: Blood tests to find your blood type and to check for the presence of any previous infections. They will also be used to check for low iron levels (anemia) and Rh antibodies. Later in the pregnancy, blood tests for diabetes will be done along with other tests if problems develop. Urine tests to check for infections, diabetes, or protein in the urine. An ultrasound to confirm the proper growth and development  of the baby. An amniocentesis to check for possible genetic problems. Fetal screens for spina bifida and Down syndrome. You may need other tests to make sure you and the baby are doing well. HOME CARE INSTRUCTIONS  Medicines Follow your health care provider's instructions regarding medicine use. Specific medicines may be either safe or unsafe to take during pregnancy. Take your prenatal vitamins as directed. If you develop constipation, try taking a stool softener if your health care provider approves. Diet Eat regular, well-balanced meals. Choose a variety of foods, such as meat or vegetable-based protein, fish, milk and low-fat dairy products, vegetables, fruits, and whole grain breads and cereals. Your health care provider will help you determine the amount of weight gain that is right for you. Avoid raw meat and uncooked cheese. These carry germs that can cause birth defects in the baby. Eating four or five small meals rather than three large meals a day may help relieve nausea and vomiting. If you start to feel nauseous, eating a few soda crackers can be helpful. Drinking liquids between meals instead of during meals also seems to help nausea and vomiting. If you develop constipation, eat more high-fiber foods, such as fresh vegetables or fruit and whole grains. Drink enough fluids to keep your urine clear or pale yellow. Activity and Exercise Exercise only as directed by your health care provider. Exercising will help you: Control your weight. Stay in shape. Be prepared for labor and delivery. Experiencing pain or cramping in the lower abdomen or low back is a good sign that you should stop exercising. Check with your health care provider before continuing normal exercises. Try to avoid standing for long periods of time. Move your legs often if you must stand in one place for a long time. Avoid heavy lifting. Wear low-heeled shoes, and practice good posture. You may continue to have sex  unless your health care provider directs you otherwise. Relief of Pain or Discomfort Wear a good support bra for breast tenderness.   Take warm sitz baths to soothe any pain or discomfort caused by hemorrhoids. Use hemorrhoid cream if your health care provider approves.   Rest with your legs elevated if you have leg cramps or low back pain. If you develop varicose veins in your legs, wear support hose. Elevate your feet for 15 minutes, 3-4 times a day. Limit salt in your diet. Prenatal Care Schedule your prenatal visits by the   twelfth week of pregnancy. They are usually scheduled monthly at first, then more often in the last 2 months before delivery. Write down your questions. Take them to your prenatal visits. Keep all your prenatal visits as directed by your health care provider. Safety Wear your seat belt at all times when driving. Make a list of emergency phone numbers, including numbers for family, friends, the hospital, and police and fire departments. General Tips Ask your health care provider for a referral to a local prenatal education class. Begin classes no later than at the beginning of month 6 of your pregnancy. Ask for help if you have counseling or nutritional needs during pregnancy. Your health care provider can offer advice or refer you to specialists for help with various needs. Do not use hot tubs, steam rooms, or saunas. Do not douche or use tampons or scented sanitary pads. Do not cross your legs for long periods of time. Avoid cat litter boxes and soil used by cats. These carry germs that can cause birth defects in the baby and possibly loss of the fetus by miscarriage or stillbirth. Avoid all smoking, herbs, alcohol, and medicines not prescribed by your health care provider. Chemicals in these affect the formation and growth of the baby. Schedule a dentist appointment. At home, brush your teeth with a soft toothbrush and be gentle when you floss. SEEK MEDICAL CARE IF:   You have dizziness. You have mild pelvic cramps, pelvic pressure, or nagging pain in the abdominal area. You have persistent nausea, vomiting, or diarrhea. You have a bad smelling vaginal discharge. You have pain with urination. You notice increased swelling in your face, hands, legs, or ankles. SEEK IMMEDIATE MEDICAL CARE IF:  You have a fever. You are leaking fluid from your vagina. You have spotting or bleeding from your vagina. You have severe abdominal cramping or pain. You have rapid weight gain or loss. You vomit blood or material that looks like coffee grounds. You are exposed to German measles and have never had them. You are exposed to fifth disease or chickenpox. You develop a severe headache. You have shortness of breath. You have any kind of trauma, such as from a fall or a car accident. Document Released: 04/10/2001 Document Revised: 08/31/2013 Document Reviewed: 02/24/2013 ExitCare Patient Information 2015 [employer]. This information is not intended to replace advice given to you by your health care provider. Make sure you discuss any questions you have with your health care provider.  

## 2024-03-31 NOTE — Progress Notes (Signed)
 INITIAL OBSTETRICAL VISIT Patient name: Kendra [name] MRN [id number]  Date of birth: 09-23-1990 Chief Complaint:   Initial Prenatal Visit  History of Present Illness:   Kendra [name] is a 33 y.o. [id number] African-American female at [redacted]w[redacted]d by LMP c/w u/s at 10 weeks with an Estimated Date of Delivery: 10/04/24 being seen today for her initial obstetrical visit.   Patient's last menstrual period was 12/29/2023 (exact date). Her obstetrical history is significant for SAB x 1, term SVB x 3, BTL in 2012 (this is 3rd pregnancy since) was not on any birth control prior to conception w/ this pregnancy.   Today she reports no complaints.  CHTN no meds Last pap thinks last year at [facility]. Results were: neg per pt     03/31/2024    2:40 PM  Depression screen PHQ 2/9  Decreased Interest 2  Down, Depressed, Hopeless 0  PHQ - 2 Score 2  Altered sleeping 0  Tired, decreased energy 0  Change in appetite 0  Feeling bad or failure about yourself  0  Trouble concentrating 0  Moving slowly or fidgety/restless 0  Suicidal thoughts 0  PHQ-9 Score 2        03/31/2024    2:41 PM  GAD 7 : Generalized Anxiety Score  Nervous, Anxious, on Edge 0  Control/stop worrying 0  Worry too much - different things 0  Trouble relaxing 0  Restless 0  Easily annoyed or irritable 0  Afraid - awful might happen 0  Total GAD 7 Score 0     Review of Systems:   Pertinent items are noted in HPI Denies cramping/contractions, leakage of fluid, vaginal bleeding, abnormal vaginal discharge w/ itching/odor/irritation, headaches, visual changes, shortness of breath, chest pain, abdominal pain, severe nausea/vomiting, or problems with urination or bowel movements unless otherwise stated above.  Pertinent History Reviewed:  Reviewed past medical,surgical, social, obstetrical and family history.  Reviewed problem list, medications and allergies. OB History  Gravida Para Term Preterm AB Living  6 4 4  0 1 4  SAB IAB  Ectopic Multiple Live Births  1 0 0 0 4    # Outcome Date GA Lbr Len/2nd Weight Sex Type Anes PTL Lv  6 Current           5 Term 08/18/16 [redacted]w[redacted]d 02:56 / 00:15 7 lb 14.8 oz (3.595 kg) M Vag-Spont EPI  LIV  4 Term 05/08/12 [redacted]w[redacted]d 01:32 / 00:04 5 lb 11 oz (2.58 kg) M Vag-Spont None  LIV  3 Term 08/08/10 [redacted]w[redacted]d   F Vag-Spont EPI  LIV  2 Term 09/08/08   6 lb 2 oz (2.778 kg) M Vag-Spont EPI  LIV  1 SAB            Physical Assessment:   Vitals:   03/31/24 1418 03/31/24 1506  BP: (!) 146/80 (!) 141/83  Pulse: (!) 115   Weight: 300 lb (136.1 kg)   Body mass index is 45.61 kg/m.       Physical Examination:  General appearance - well appearing, and in no distress  Mental status - alert, oriented to person, place, and time  Psych:  She has a normal mood and affect  Skin - warm and dry, normal color, no suspicious lesions noted  Chest - effort normal, all lung fields clear to auscultation bilaterally  Heart - normal rate and regular rhythm  Abdomen - soft, nontender  Extremities:  No swelling or varicosities noted  Thin prep pap is not  done   Chaperone: N/A  TODAY'S informal TA u/s: +FCA and active fetus  Results for orders placed or performed in visit on 03/31/24 (from the past 24 hours)  POC Urinalysis Dipstick OB   Collection Time: 03/31/24  3:07 PM  Result Value Ref Range   Color, UA     Clarity, UA     Glucose, UA Negative Negative   Bilirubin, UA     Ketones, UA Trace    Spec Grav, UA     Blood, UA NEg    pH, UA     POC,PROTEIN,UA Negative Negative, Trace, Small (1+), Moderate (2+), Large (3+), 4+   Urobilinogen, UA     Nitrite, UA Neg    Leukocytes, UA Trace (A) Negative   Appearance     Odor      Assessment & Plan:  1) High-Risk Pregnancy [id number] at [redacted]w[redacted]d with an Estimated Date of Delivery: 10/04/24   2) Initial OB visit  3) CHTN> no meds prior to pregnancy, rx labetalol 200mg  BID, ASA, get baseline labs  4) PGBMI 44  5) H/O BTL 2012> this is 3rd pregnancy  since  Meds:  Meds ordered this encounter  Medications   Blood Pressure Monitoring (BLOOD PRESSURE CUFF) MISC    Sig: 1 kit by Does not apply route as needed.    Dispense:  1 each    Refill:  0    Z34.90  Please mail to patient   aspirin EC 81 MG tablet    Sig: Take 2 tablets (162 mg total) by mouth daily. Swallow whole.    Dispense:  180 tablet    Refill:  2   labetalol (NORMODYNE) 200 MG tablet    Sig: Take 1 tablet (200 mg total) by mouth 2 (two) times daily.    Dispense:  60 tablet    Refill:  3    Initial labs obtained Continue prenatal vitamins Reviewed n/v relief measures and warning s/s to report Reviewed recommended weight gain based on pre-gravid BMI Encouraged well-balanced diet Genetic & carrier screening discussed: requests Panorama, declines AFP and Horizon  Ultrasound discussed; fetal survey: requested CCNC completed> form faxed if has or is planning to apply for medicaid The nature of [facility] [employer] with multiple MDs and other Advanced Practice Providers was explained to patient; also emphasized that fellows, residents, and students are part of our team. Does not have home bp cuff. Office bp cuff given: no. Rx sent: yes. Check bp weekly, let us  know if consistently >140/90.   Follow-up: Return in about 3 weeks (around 04/21/2024) for HROB, MD or CNM, in person; then @ 20w for anatomy u/s and HROB w/ MD/CNM (get pap from [facility]).   Orders Placed This Encounter  Procedures   Urine Culture   CBC/D/Plt+RPR+Rh+ABO+RubIgG...   Comprehensive metabolic panel with GFR   Hemoglobin A1c   PANORAMA PRENATAL TEST   Protein / creatinine ratio, urine   POC Urinalysis Dipstick OB    Suzen JONELLE Fetters CNM, [facility] 03/31/2024 3:22 PM

## 2024-04-01 ENCOUNTER — Encounter: Admitting: Advanced Practice Midwife

## 2024-04-01 ENCOUNTER — Encounter: Admitting: *Deleted

## 2024-04-01 LAB — CBC/D/PLT+RPR+RH+ABO+RUBIGG...
Antibody Screen: NEGATIVE
Basophils Absolute: 0.1 x10E3/uL (ref 0.0–0.2)
Basos: 1 %
EOS (ABSOLUTE): 0.1 x10E3/uL (ref 0.0–0.4)
Eos: 1 %
HCV Ab: NONREACTIVE
HIV Screen 4th Generation wRfx: NONREACTIVE
Hematocrit: 38.6 % (ref 34.0–46.6)
Hemoglobin: 13 g/dL (ref 11.1–15.9)
Hepatitis B Surface Ag: NEGATIVE
Immature Grans (Abs): 0 x10E3/uL (ref 0.0–0.1)
Immature Granulocytes: 0 %
Lymphocytes Absolute: 2.5 x10E3/uL (ref 0.7–3.1)
Lymphs: 22 %
MCH: 30.7 pg (ref 26.6–33.0)
MCHC: 33.7 g/dL (ref 31.5–35.7)
MCV: 91 fL (ref 79–97)
Monocytes Absolute: 0.6 x10E3/uL (ref 0.1–0.9)
Monocytes: 5 %
Neutrophils Absolute: 7.9 x10E3/uL — ABNORMAL HIGH (ref 1.4–7.0)
Neutrophils: 71 %
Platelets: 385 x10E3/uL (ref 150–450)
RBC: 4.23 x10E6/uL (ref 3.77–5.28)
RDW: 12.9 % (ref 11.7–15.4)
RPR Ser Ql: NONREACTIVE
Rh Factor: POSITIVE
Rubella Antibodies, IGG: 2.19 {index} (ref >0.99)
WBC: 11.3 x10E3/uL — ABNORMAL HIGH (ref 3.4–10.8)

## 2024-04-01 LAB — COMPREHENSIVE METABOLIC PANEL WITH GFR
ALT: 14 IU/L (ref 0–32)
AST: 14 IU/L (ref 0–40)
Albumin: 4 g/dL (ref 3.9–4.9)
Alkaline Phosphatase: 74 IU/L (ref 41–116)
BUN/Creatinine Ratio: 12 (ref 9–23)
BUN: 8 mg/dL (ref 6–20)
Bilirubin Total: 0.4 mg/dL (ref 0.0–1.2)
CO2: 22 mmol/L (ref 20–29)
Calcium: 9.9 mg/dL (ref 8.7–10.2)
Chloride: 100 mmol/L (ref 96–106)
Creatinine, Ser: 0.67 mg/dL (ref 0.57–1.00)
Globulin, Total: 2.7 g/dL (ref 1.5–4.5)
Glucose: 81 mg/dL (ref 70–99)
Potassium: 4 mmol/L (ref 3.5–5.2)
Sodium: 137 mmol/L (ref 134–144)
Total Protein: 6.7 g/dL (ref 6.0–8.5)
eGFR: 117 mL/min/1.73 (ref >59)

## 2024-04-01 LAB — PROTEIN / CREATININE RATIO, URINE
Creatinine, Urine: 200.1 mg/dL
Protein, Ur: 12.5 mg/dL
Protein/Creat Ratio: 62 mg/g{creat} (ref 0–200)

## 2024-04-01 LAB — HEMOGLOBIN A1C
Est. average glucose Bld gHb Est-mCnc: 100 mg/dL
Hgb A1c MFr Bld: 5.1 % (ref 4.8–5.6)

## 2024-04-01 LAB — HCV INTERPRETATION

## 2024-04-02 LAB — CERVICOVAGINAL ANCILLARY ONLY
Chlamydia: NEGATIVE
Comment: NEGATIVE
Comment: NORMAL
Neisseria Gonorrhea: NEGATIVE

## 2024-04-03 LAB — URINE CULTURE

## 2024-04-06 LAB — PANORAMA PRENATAL TEST FULL PANEL:PANORAMA TEST PLUS 5 ADDITIONAL MICRODELETIONS: FETAL FRACTION: 6.1

## 2024-04-07 ENCOUNTER — Ambulatory Visit: Payer: Self-pay | Admitting: Women's Health

## 2024-04-07 DIAGNOSIS — O0990 Supervision of high risk pregnancy, unspecified, unspecified trimester: Secondary | ICD-10-CM

## 2024-04-20 ENCOUNTER — Ambulatory Visit: Admitting: Advanced Practice Midwife

## 2024-04-20 ENCOUNTER — Encounter: Payer: Self-pay | Admitting: Advanced Practice Midwife

## 2024-04-20 VITALS — BP 129/83 | HR 80 | Wt 306.0 lb

## 2024-04-20 DIAGNOSIS — O09893 Supervision of other high risk pregnancies, third trimester: Secondary | ICD-10-CM | POA: Diagnosis not present

## 2024-04-20 DIAGNOSIS — O10912 Unspecified pre-existing hypertension complicating pregnancy, second trimester: Secondary | ICD-10-CM

## 2024-04-20 DIAGNOSIS — O10919 Unspecified pre-existing hypertension complicating pregnancy, unspecified trimester: Secondary | ICD-10-CM

## 2024-04-20 DIAGNOSIS — Z3A16 16 weeks gestation of pregnancy: Secondary | ICD-10-CM

## 2024-04-20 DIAGNOSIS — O0990 Supervision of high risk pregnancy, unspecified, unspecified trimester: Secondary | ICD-10-CM

## 2024-04-20 DIAGNOSIS — Z1379 Encounter for other screening for genetic and chromosomal anomalies: Secondary | ICD-10-CM | POA: Diagnosis not present

## 2024-04-20 NOTE — Patient Instructions (Addendum)
 Kendra [name], I greatly value your feedback.  If you receive a survey following your visit with us  today, we appreciate you taking the time to fill it out.  Thanks, Kendra [name], CNM   For Headaches:  Stay well hydrated, drink enough water so that your urine is clear, sometimes if you are dehydrated you can get headaches Eat small frequent meals and snacks, sometimes if you are hungry you can get headaches Sometimes you get headaches during pregnancy from the pregnancy hormones You can try tylenol  (1-2 regular strength 325mg  or 1-2 extra strength 500mg ) as directed on the box. The least amount of medication that works is best.  Cool compresses (cool wet washcloth or ice pack) to area of head that is hurting You can also try drinking a caffeinated drink to see if this will help If not helping, try below:  For Prevention of Headaches/Migraines: CoQ10 100mg  three times daily Vitamin B2 400mg  daily Magnesium Oxide 400-600mg  daily  Foods to alleviate migraines:  1) dark leafy greens 2) avocado 3) tuna 4) salmon  5) beans and legumes  Foods to avoid: 1) Excessive (or irregular timing) coffee 3) aged cheeses 4) chocolate 5) citrus fruits 6) aspartame and other artifical sweeteners 7) yeast 8) MSG (in processed foods) 9) processed and cured meats 10) nuts and certain seeds 11) chicken livers and other organ meats 12) dairy products like buttermilk, sour cream, and yogurt 13) dried fruits like dates, figs, and raisins 14) garlic 15) onions 16) potato chips 17) pickled foods like olives and sauerkraut 18) some fresh fruits like ripe banana, papaya, red plums, raspberries, kiwi, pineapple 19) tomato-based products  Recommend to keep a migraine diary: rate daily the severity of your headache (1-10) and what foods you eat that day to help determine patterns.   If You Get a Bad Headache/Migraine: Benadryl  25mg   Magnesium Oxide 1 large Gatorade 2 extra strength  Tylenol  (1,000mg  total) 1 cup coffee or Coke      If this doesn't help please call us  @ [phone]   [facility] HAS MOVED!!! It is now [facility] ([address] [place], [place] [address]) Entrance located off of [address] Free 24/7 valet parking   Go to [email or web address] to register for FREE online childbirth classes    Second Trimester of Pregnancy The second trimester is from week 14 through week 27 (months 4 through 6). The second trimester is often a time when you feel your best. Your body has adjusted to being pregnant, and you begin to feel better physically. Usually, morning sickness has lessened or quit completely, you may have more energy, and you may have an increase in appetite. The second trimester is also a time when the fetus is growing rapidly. At the end of the sixth month, the fetus is about 9 inches long and weighs about 1 pounds. You will likely begin to feel the baby move (quickening) between 16 and 20 weeks of pregnancy. Body changes during your second trimester Your body continues to go through many changes during your second trimester. The changes vary from woman to woman. Your weight will continue to increase. You will notice your lower abdomen bulging out. You may begin to get stretch marks on your hips, abdomen, and breasts. You may develop headaches that can be relieved by medicines. The medicines should be approved by your health care provider. You may urinate more often because the fetus is pressing on your bladder. You may  develop or continue to have heartburn as a result of your pregnancy. You may develop constipation because certain hormones are causing the muscles that push waste through your intestines to slow down. You may develop hemorrhoids or swollen, bulging veins (varicose veins). You may have back pain. This is caused by: Weight gain. Pregnancy hormones that are relaxing the joints in your pelvis. A  shift in weight and the muscles that support your balance. Your breasts will continue to grow and they will continue to become tender. Your gums may bleed and may be sensitive to brushing and flossing. Dark spots or blotches (chloasma, mask of pregnancy) may develop on your face. This will likely fade after the baby is born. A dark line from your belly button to the pubic area (linea nigra) may appear. This will likely fade after the baby is born. You may have changes in your hair. These can include thickening of your hair, rapid growth, and changes in texture. Some women also have hair loss during or after pregnancy, or hair that feels dry or thin. Your hair will most likely return to normal after your baby is born.  What to expect at prenatal visits During a routine prenatal visit: You will be weighed to make sure you and the fetus are growing normally. Your blood pressure will be taken. Your abdomen will be measured to track your baby's growth. The fetal heartbeat will be listened to. Any test results from the previous visit will be discussed.  Your health care provider may ask you: How you are feeling. If you are feeling the baby move. If you have had any abnormal symptoms, such as leaking fluid, bleeding, severe headaches, or abdominal cramping. If you are using any tobacco products, including cigarettes, chewing tobacco, and electronic cigarettes. If you have any questions.  Other tests that may be performed during your second trimester include: Blood tests that check for: Low iron levels (anemia). High blood sugar that affects pregnant women (gestational diabetes) between 52 and 28 weeks. Rh antibodies. This is to check for a protein on red blood cells (Rh factor). Urine tests to check for infections, diabetes, or protein in the urine. An ultrasound to confirm the proper growth and development of the baby. An amniocentesis to check for possible genetic problems. Fetal screens for  spina bifida and Down syndrome. HIV (human immunodeficiency virus) testing. Routine prenatal testing includes screening for HIV, unless you choose not to have this test.  Follow these instructions at home: Medicines Follow your health care provider's instructions regarding medicine use. Specific medicines may be either safe or unsafe to take during pregnancy. Take a prenatal vitamin that contains at least 600 micrograms (mcg) of folic acid. If you develop constipation, try taking a stool softener if your health care provider approves. Eating and drinking Eat a balanced diet that includes fresh fruits and vegetables, whole grains, good sources of protein such as meat, eggs, or tofu, and low-fat dairy. Your health care provider will help you determine the amount of weight gain that is right for you. Avoid raw meat and uncooked cheese. These carry germs that can cause birth defects in the baby. If you have low calcium intake from food, talk to your health care provider about whether you should take a daily calcium supplement. Limit foods that are high in fat and processed sugars, such as fried and sweet foods. To prevent constipation: Drink enough fluid to keep your urine clear or pale yellow. Eat foods that are high in  fiber, such as fresh fruits and vegetables, whole grains, and beans. Activity Exercise only as directed by your health care provider. Most women can continue their usual exercise routine during pregnancy. Try to exercise for 30 minutes at least 5 days a week. Stop exercising if you experience uterine contractions. Avoid heavy lifting, wear low heel shoes, and practice good posture. A sexual relationship may be continued unless your health care provider directs you otherwise. Relieving pain and discomfort Wear a good support bra to prevent discomfort from breast tenderness. Take warm sitz baths to soothe any pain or discomfort caused by hemorrhoids. Use hemorrhoid cream if your  health care provider approves. Rest with your legs elevated if you have leg cramps or low back pain. If you develop varicose veins, wear support hose. Elevate your feet for 15 minutes, 3-4 times a day. Limit salt in your diet. Prenatal Care Write down your questions. Take them to your prenatal visits. Keep all your prenatal visits as told by your health care provider. This is important. Safety Wear your seat belt at all times when driving. Make a list of emergency phone numbers, including numbers for family, friends, the hospital, and police and fire departments. General instructions Ask your health care provider for a referral to a local prenatal education class. Begin classes no later than the beginning of month 6 of your pregnancy. Ask for help if you have counseling or nutritional needs during pregnancy. Your health care provider can offer advice or refer you to specialists for help with various needs. Do not use hot tubs, steam rooms, or saunas. Do not douche or use tampons or scented sanitary pads. Do not cross your legs for long periods of time. Avoid cat litter boxes and soil used by cats. These carry germs that can cause birth defects in the baby and possibly loss of the fetus by miscarriage or stillbirth. Avoid all smoking, herbs, alcohol, and unprescribed drugs. Chemicals in these products can affect the formation and growth of the baby. Do not use any products that contain nicotine or tobacco, such as cigarettes and e-cigarettes. If you need help quitting, ask your health care provider. Visit your dentist if you have not gone yet during your pregnancy. Use a soft toothbrush to brush your teeth and be gentle when you floss. Contact a health care provider if: You have dizziness. You have mild pelvic cramps, pelvic pressure, or nagging pain in the abdominal area. You have persistent nausea, vomiting, or diarrhea. You have a bad smelling vaginal discharge. You have pain when you  urinate. Get help right away if: You have a fever. You are leaking fluid from your vagina. You have spotting or bleeding from your vagina. You have severe abdominal cramping or pain. You have rapid weight gain or weight loss. You have shortness of breath with chest pain. You notice sudden or extreme swelling of your face, hands, ankles, feet, or legs. You have not felt your baby move in over an hour. You have severe headaches that do not go away when you take medicine. You have vision changes. Summary The second trimester is from week 14 through week 27 (months 4 through 6). It is also a time when the fetus is growing rapidly. Your body goes through many changes during pregnancy. The changes vary from woman to woman. Avoid all smoking, herbs, alcohol, and unprescribed drugs. These chemicals affect the formation and growth your baby. Do not use any tobacco products, such as cigarettes, chewing tobacco, and e-cigarettes. If  you need help quitting, ask your health care provider. Contact your health care provider if you have any questions. Keep all prenatal visits as told by your health care provider. This is important. This information is not intended to replace advice given to you by your health care provider. Make sure you discuss any questions you have with your health care provider.

## 2024-04-20 NOTE — Progress Notes (Signed)
 "   HIGH-RISK PREGNANCY VISIT Patient name: Kendra [name] MRN [id number]  Date of birth: 08/12/1990 Chief Complaint:   Routine Prenatal Visit (AFP)  History of Present Illness:   LIAN POUNDS is a 33 y.o. [id number] female at [redacted]w[redacted]d with an Estimated Date of Delivery: 10/04/24 being seen today for ongoing management of a high-risk pregnancy complicated by chronic hypertension currently on labetaolo 200mg  BID.    Today she reports no complaints. Contractions: Not present.  .  Movement: Absent. denies leaking of fluid.      03/31/2024    2:40 PM  Depression screen PHQ 2/9  Decreased Interest 2  Down, Depressed, Hopeless 0  PHQ - 2 Score 2  Altered sleeping 0  Tired, decreased energy 0  Change in appetite 0  Feeling bad or failure about yourself  0  Trouble concentrating 0  Moving slowly or fidgety/restless 0  Suicidal thoughts 0  PHQ-9 Score 2        03/31/2024    2:41 PM  GAD 7 : Generalized Anxiety Score  Nervous, Anxious, on Edge 0  Control/stop worrying 0  Worry too much - different things 0  Trouble relaxing 0  Restless 0  Easily annoyed or irritable 0  Afraid - awful might happen 0  Total GAD 7 Score 0     Review of Systems:   Pertinent items are noted in HPI Denies abnormal vaginal discharge w/ itching/odor/irritation, headaches, visual changes, shortness of breath, chest pain, abdominal pain, severe nausea/vomiting, or problems with urination or bowel movements unless otherwise stated above. Pertinent History Reviewed:  Reviewed past medical,surgical, social, obstetrical and family history.  Reviewed problem list, medications and allergies. Physical Assessment:   Vitals:   04/20/24 1547 04/20/24 1606  BP: 136/79 129/83  Pulse: 81 80  Weight: (!) 306 lb (138.8 kg)   Body mass index is 46.53 kg/m.           Physical Examination:   General appearance: alert, well appearing, and in no distress  Mental status: alert, oriented to person, place, and  time  Skin: warm & dry   Extremities: Edema: None    Cardiovascular: normal heart rate noted  Respiratory: normal respiratory effort, no distress  Abdomen: gravid, soft, non-tender  Pelvic: Cervical exam deferred         Chaperone: N/A    Fetal Status:     Movement: Absent    Fetal Surveillance Testing today: doppler 150     No results found for this or any previous visit (from the past 24 hours).  Assessment & Plan:  High-risk pregnancy: [id number] at [redacted]w[redacted]d with an Estimated Date of Delivery: 10/04/24   1. Supervision of high risk pregnancy, antepartum (Primary)   2. [redacted] weeks gestation of pregnancy   3. Genetic testing  - AFP, Serum, Open Spina Bifida  4. Chronic hypertension affecting pregnancy Continue meds    Meds: No orders of the defined types were placed in this encounter.   Orders:  Orders Placed This Encounter  Procedures   AFP, Serum, Open Spina Bifida     Labs/procedures today: AFP   Reviewed: general obstetric precautions including but not limited to vaginal bleeding, contractions, leaking of fluid and fetal movement were reviewed in detail with the patient.  All questions were answered. Does have home bp cuff. Office bp cuff given: not applicable. Check bp weekly, let us  know if consistently >140 and/or >90.  Follow-up: Return for As scheduled.   Future Appointments  Date  Time Provider Department Center  05/18/2024  3:00 PM [facility] IMG 2 [facility] None  05/18/2024  3:50 PM Booker, Suzen SAUNDERS, CNM [facility]    Orders Placed This Encounter  Procedures   AFP, Serum, Open Spina Bifida   Cathlean Ely , DNP, CNM [facility] 04/20/2024 4:31 PM  "

## 2024-04-23 LAB — AFP, SERUM, OPEN SPINA BIFIDA
AFP MoM: 0.93
AFP Value: 20.6 ng/mL
Gest. Age on Collection Date: 16.1 wk
Maternal Age At EDD: 36.2 a
OSBR Risk 1 IN: 10000
Test Results:: NEGATIVE
Weight: 306 [lb_av]

## 2024-05-15 ENCOUNTER — Other Ambulatory Visit: Payer: Self-pay | Admitting: Obstetrics & Gynecology

## 2024-05-15 DIAGNOSIS — Z363 Encounter for antenatal screening for malformations: Secondary | ICD-10-CM

## 2024-05-18 ENCOUNTER — Ambulatory Visit

## 2024-05-18 ENCOUNTER — Ambulatory Visit: Admitting: Women's Health

## 2024-05-18 ENCOUNTER — Encounter: Payer: Self-pay | Admitting: Women's Health

## 2024-05-18 VITALS — BP 142/86 | HR 80 | Wt 314.0 lb

## 2024-05-18 DIAGNOSIS — Z3A20 20 weeks gestation of pregnancy: Secondary | ICD-10-CM

## 2024-05-18 DIAGNOSIS — Z363 Encounter for antenatal screening for malformations: Secondary | ICD-10-CM | POA: Diagnosis not present

## 2024-05-18 DIAGNOSIS — O09892 Supervision of other high risk pregnancies, second trimester: Secondary | ICD-10-CM

## 2024-05-18 DIAGNOSIS — O99612 Diseases of the digestive system complicating pregnancy, second trimester: Secondary | ICD-10-CM | POA: Diagnosis not present

## 2024-05-18 DIAGNOSIS — O0990 Supervision of high risk pregnancy, unspecified, unspecified trimester: Secondary | ICD-10-CM

## 2024-05-18 DIAGNOSIS — K5900 Constipation, unspecified: Secondary | ICD-10-CM

## 2024-05-18 DIAGNOSIS — O10912 Unspecified pre-existing hypertension complicating pregnancy, second trimester: Secondary | ICD-10-CM | POA: Diagnosis not present

## 2024-05-18 DIAGNOSIS — Z3A19 19 weeks gestation of pregnancy: Secondary | ICD-10-CM

## 2024-05-18 DIAGNOSIS — O09522 Supervision of elderly multigravida, second trimester: Secondary | ICD-10-CM | POA: Diagnosis not present

## 2024-05-18 DIAGNOSIS — O10919 Unspecified pre-existing hypertension complicating pregnancy, unspecified trimester: Secondary | ICD-10-CM

## 2024-05-18 DIAGNOSIS — Z6841 Body Mass Index (BMI) 40.0 and over, adult: Secondary | ICD-10-CM

## 2024-05-18 DIAGNOSIS — O0992 Supervision of high risk pregnancy, unspecified, second trimester: Secondary | ICD-10-CM

## 2024-05-18 LAB — POCT URINALYSIS DIPSTICK OB
Blood, UA: NEGATIVE
Glucose, UA: NEGATIVE
Ketones, UA: NEGATIVE
Leukocytes, UA: NEGATIVE
Nitrite, UA: NEGATIVE
POC,PROTEIN,UA: NEGATIVE

## 2024-05-18 MED ORDER — BLOOD PRESSURE CUFF MISC: 1.0000 | 0 refills | Status: AC | PRN

## 2024-05-18 MED ORDER — LABETALOL HCL 300 MG PO TABS: 300.0000 mg | ORAL_TABLET | Freq: Two times a day (BID) | ORAL | 3 refills | Status: AC

## 2024-05-18 NOTE — Progress Notes (Signed)
 "  HIGH-RISK PREGNANCY VISIT Patient name: Kendra [name] MRN [id number]  Date of birth: May 23, 1990 Chief Complaint:   Routine Prenatal Visit  History of Present Illness:   Kendra [name] is a 34 y.o. [id number] female at [redacted]w[redacted]d with an Estimated Date of Delivery: 10/04/24 being seen today for ongoing management of a high-risk pregnancy complicated by chronic hypertension currently on labetalol  200mg  BID and PG BMI 44.    Today she reports constipation. Contractions: Not present. Vag. Bleeding: None.  Movement: Present. denies leaking of fluid.      03/31/2024    2:40 PM  Depression screen PHQ 2/9  Decreased Interest 2  Down, Depressed, Hopeless 0  PHQ - 2 Score 2  Altered sleeping 0  Tired, decreased energy 0  Change in appetite 0  Feeling bad or failure about yourself  0  Trouble concentrating 0  Moving slowly or fidgety/restless 0  Suicidal thoughts 0  PHQ-9 Score 2        03/31/2024    2:41 PM  GAD 7 : Generalized Anxiety Score  Nervous, Anxious, on Edge 0   Control/stop worrying 0   Worry too much - different things 0   Trouble relaxing 0   Restless 0   Easily annoyed or irritable 0   Afraid - awful might happen 0   Total GAD 7 Score 0     Data saved with a previous flowsheet row definition     Review of Systems:   Pertinent items are noted in HPI Denies abnormal vaginal discharge w/ itching/odor/irritation, headaches, visual changes, shortness of breath, chest pain, abdominal pain, severe nausea/vomiting, or problems with urination or bowel movements unless otherwise stated above. Pertinent History Reviewed:  Reviewed past medical,surgical, social, obstetrical and family history.  Reviewed problem list, medications and allergies. Physical Assessment:   Vitals:   05/18/24 1556 05/18/24 1600  BP: (!) 145/84 (!) 142/86  Pulse: 80 80  Weight:  (!) 314 lb (142.4 kg)  Body mass index is 47.74 kg/m.           Physical Examination:   General appearance: alert,  well appearing, and in no distress  Mental status: alert, oriented to person, place, and time  Skin: warm & dry   Extremities:      Cardiovascular: normal heart rate noted  Respiratory: normal respiratory effort, no distress  Abdomen: gravid, soft, non-tender  Pelvic: Cervical exam deferred         Fetal Status:     Movement: Present    Fetal Surveillance Testing today: US  20+1 wks,cephalic,anterior placenta gr 0,normal ovaries,FHR 143 bpm,SVP of fluid 5.4 cm,cx 4.8 cm,EFW 312 g 26%,anatomy complete,no obvious abnormalities   Chaperone: N/A  Results for orders placed or performed in visit on 05/18/24 (from the past 24 hours)  POC Urinalysis Dipstick OB   Collection Time: 05/18/24  4:12 PM  Result Value Ref Range   Color, UA     Clarity, UA     Glucose, UA Negative Negative   Bilirubin, UA     Ketones, UA Neg    Spec Grav, UA     Blood, UA Neg    pH, UA     POC,PROTEIN,UA Negative Negative, Trace, Small (1+), Moderate (2+), Large (3+), 4+   Urobilinogen, UA     Nitrite, UA Neg    Leukocytes, UA Negative Negative   Appearance     Odor      Assessment & Plan:  High-risk pregnancy: [id number] at [redacted]w[redacted]d with  an Estimated Date of Delivery: 10/04/24   1) CHTN, increase labetalol  to 300mg  BID, f/u 1wk for bp check w/ nurse, continue ASA, re-rx'd bp cuff  2) PGBMI 44, currently 47  3) Constipation> gave printed prevention/relief measures   Meds:  Meds ordered this encounter  Medications   Blood Pressure Monitoring (BLOOD PRESSURE CUFF) MISC    Sig: 1 kit by Does not apply route as needed.    Dispense:  1 each    Refill:  0    Z34.90  Please mail to patient   labetalol  (NORMODYNE ) 300 MG tablet    Sig: Take 1 tablet (300 mg total) by mouth 2 (two) times daily.    Dispense:  60 tablet    Refill:  3    Labs/procedures today: U/S  Treatment Plan:   EFW q 4w @ 24w    2x/wk testing nst/sono @ 32wks     Deliver 37-39.0wks (or as per MFM w/ poor control)____   Reviewed: Preterm  labor symptoms and general obstetric precautions including but not limited to vaginal bleeding, contractions, leaking of fluid and fetal movement were reviewed in detail with the patient.  All questions were answered. Does not have home bp cuff. Office bp cuff given: not applicable. Resent rx today. If doesn't hear from them can buy cuff. Check bp daily, let us  know if consistently >140 and/or >90.  Follow-up: Return for 1wk bp check/nurse in person; 4wks EFW u/s and HROB md/cnm; please get pap from [facility] (2nd request).   Future Appointments  Date Time Provider Department Center  05/25/2024  3:50 PM [facility] NURSE [facility]  06/16/2024  2:15 PM [facility] IMG 2 [facility] None    Orders Placed This Encounter  Procedures   US  OB Follow Up   POC Urinalysis Dipstick OB   Suzen JONELLE Fetters CNM, [facility] 05/18/2024 4:21 PM  "

## 2024-05-18 NOTE — Progress Notes (Signed)
 US  20+1 wks,cephalic,anterior placenta gr 0,normal ovaries,FHR 143 bpm,SVP of fluid 5.4 cm,cx 4.8 cm,EFW 312 g 26%,anatomy complete,no obvious abnormalities

## 2024-05-18 NOTE — Patient Instructions (Addendum)
 Kendra [name], thank you for choosing our office today! We appreciate the opportunity to meet your healthcare needs. You may receive a short survey by mail, e-mail, or through [employer]. If you are happy with your care we would appreciate if you could take just a few minutes to complete the survey questions. We read all of your comments and take your feedback very seriously. Thank you again for choosing our office.  Center for [employer] Team at [facility] at [facility] ([address] [place], [place] [address]) Entrance C, located off of [address] 24/7 valet parking   Constipation Drink plenty of fluid, preferably water, throughout the day Eat foods high in fiber such as fruits, vegetables, and grains Exercise, such as walking, is a good way to keep your bowels regular Drink warm fluids, especially warm prune juice, or decaf coffee Eat a 1/2 cup of real oatmeal (not instant), 1/2 cup applesauce, and 1/2-1 cup warm prune juice every day If needed, you may take Colace (docusate sodium ) stool softener once or twice a day to help keep the stool soft. If you are pregnant, wait until you are out of your first trimester (12-14 weeks of pregnancy) If you still are having problems with constipation, you may take Miralax once daily as needed to help keep your bowels regular.  If you are pregnant, wait until you are out of your first trimester (12-14 weeks of pregnancy)   Go to [email or web address] to register for FREE online childbirth classes  Call the office [phone]) or go to [facility] if: You begin to severe cramping Your water breaks.  Sometimes it is a big gush of fluid, sometimes it is just a trickle that keeps getting your panties wet or running down your legs You have vaginal bleeding.  It is normal to have a small amount of spotting if your cervix was checked.   [facility] Pediatricians/Family Doctors [facility]): [address]  [name], [phone]           [facility]: [address], [phone]                [facility]): [address], [phone] (call to ask if accepting patients) [facility] Department: [address], [place], [phone]    [facility] Pediatricians/Family Doctors [facility]): [phone] [address], [phone] [facility]: [address] [place], [phone] [facility]: [address], [phone]  [facility] Doctors  [facility]): [phone] [facility]: [address], [phone]   [facility] Doctors [facility]: [address], [phone]  [facility] Doctors  [facility]: [phone], [phone]  Home Blood Pressure Monitoring for Patients   Your provider has recommended that you check your blood pressure (BP) at least once a week at home. If you do not have a blood pressure cuff at home, one will be provided for you. Contact your provider if you have not received your monitor within 1 week.   Helpful Tips for Accurate Home Blood Pressure Checks  Don't smoke, exercise, or drink caffeine  30 minutes before checking your BP Use the restroom before checking your BP (a full bladder can raise your pressure) Relax in a comfortable upright chair Feet on the ground Left arm resting comfortably on a flat surface at the level of your  heart Legs uncrossed Back supported Sit quietly and don't talk Place the cuff on your bare arm Adjust snuggly, so that only two fingertips can fit between your skin and the top of the cuff Check 2 readings separated by at least one minute Keep a log of your BP readings For a visual, please reference this diagram: http://ccnc.care/bpdiagram  Provider Name: [facility]     Phone: [phone]  Zone 1: ALL CLEAR   Continue to monitor your symptoms:  BP reading is less than 140 (top number) or less than 90 (bottom number)  No right upper stomach pain No headaches or seeing spots No feeling nauseated or throwing up No swelling in face and hands  Zone 2: CAUTION Call your doctor's office for any of the following:  BP reading is greater than 140 (top number) or greater than 90 (bottom number)  Stomach pain under your ribs in the middle or right side Headaches or seeing spots Feeling nauseated or throwing up Swelling in face and hands  Zone 3: EMERGENCY  Seek immediate medical care if you have any of the following:  BP reading is greater than160 (top number) or greater than 110 (bottom number) Severe headaches not improving with Tylenol  Serious difficulty catching your breath Any worsening symptoms from Zone 2     Second Trimester of Pregnancy The second trimester is from week 14 through week 27 (months 4 through 6). The second trimester is often a time when you feel your best. Your body has adjusted to being pregnant, and you begin to feel better physically. Usually, morning sickness has lessened or quit completely, you may have more energy, and you may have an increase in appetite. The second trimester is also a time when the fetus is growing rapidly. At the end of the sixth month, the fetus is about 9 inches long and weighs about 1 pounds. You will likely begin to feel the baby move (quickening) between 16 and 20 weeks of pregnancy. Body changes during your second trimester Your body continues to go through many changes during your second trimester. The changes vary from woman to woman. Your weight will continue to increase. You will notice your lower abdomen bulging out. You may begin to get stretch marks on your hips, abdomen, and breasts. You may develop headaches that can be relieved by medicines. The medicines should be approved by your health care provider. You may urinate more often  because the fetus is pressing on your bladder. You may develop or continue to have heartburn as a result of your pregnancy. You may develop constipation because certain hormones are causing the muscles that push waste through your intestines to slow down. You may develop hemorrhoids or swollen, bulging veins (varicose veins). You may have back pain. This is caused by: Weight gain. Pregnancy hormones that are relaxing the joints in your pelvis. A shift in weight and the muscles that support your balance. Your breasts will continue to grow and they will continue to become tender. Your gums may bleed and may be sensitive to brushing and flossing. Dark spots or blotches (chloasma, mask of pregnancy) may develop on your face. This will likely fade after the baby is born. A dark line from your belly button to the pubic area (linea nigra) may appear. This will likely fade after the baby is born. You may have changes in your hair. These can include thickening of your hair, rapid growth, and changes in texture. Some women also have hair loss during  or after pregnancy, or hair that feels dry or thin. Your hair will most likely return to normal after your baby is born.  What to expect at prenatal visits During a routine prenatal visit: You will be weighed to make sure you and the fetus are growing normally. Your blood pressure will be taken. Your abdomen will be measured to track your baby's growth. The fetal heartbeat will be listened to. Any test results from the previous visit will be discussed.  Your health care provider may ask you: How you are feeling. If you are feeling the baby move. If you have had any abnormal symptoms, such as leaking fluid, bleeding, severe headaches, or abdominal cramping. If you are using any tobacco products, including cigarettes, chewing tobacco, and electronic cigarettes. If you have any questions.  Other tests that may be performed during your second trimester  include: Blood tests that check for: Low iron levels (anemia). High blood sugar that affects pregnant women (gestational diabetes) between 31 and 28 weeks. Rh antibodies. This is to check for a protein on red blood cells (Rh factor). Urine tests to check for infections, diabetes, or protein in the urine. An ultrasound to confirm the proper growth and development of the baby. An amniocentesis to check for possible genetic problems. Fetal screens for spina bifida and Down syndrome. HIV (human immunodeficiency virus) testing. Routine prenatal testing includes screening for HIV, unless you choose not to have this test.  Follow these instructions at home: Medicines Follow your health care provider's instructions regarding medicine use. Specific medicines may be either safe or unsafe to take during pregnancy. Take a prenatal vitamin that contains at least 600 micrograms (mcg) of folic acid. If you develop constipation, try taking a stool softener if your health care provider approves. Eating and drinking Eat a balanced diet that includes fresh fruits and vegetables, whole grains, good sources of protein such as meat, eggs, or tofu, and low-fat dairy. Your health care provider will help you determine the amount of weight gain that is right for you. Avoid raw meat and uncooked cheese. These carry germs that can cause birth defects in the baby. If you have low calcium intake from food, talk to your health care provider about whether you should take a daily calcium supplement. Limit foods that are high in fat and processed sugars, such as fried and sweet foods. To prevent constipation: Drink enough fluid to keep your urine clear or pale yellow. Eat foods that are high in fiber, such as fresh fruits and vegetables, whole grains, and beans. Activity Exercise only as directed by your health care provider. Most women can continue their usual exercise routine during pregnancy. Try to exercise for 30  minutes at least 5 days a week. Stop exercising if you experience uterine contractions. Avoid heavy lifting, wear low heel shoes, and practice good posture. A sexual relationship may be continued unless your health care provider directs you otherwise. Relieving pain and discomfort Wear a good support bra to prevent discomfort from breast tenderness. Take warm sitz baths to soothe any pain or discomfort caused by hemorrhoids. Use hemorrhoid cream if your health care provider approves. Rest with your legs elevated if you have leg cramps or low back pain. If you develop varicose veins, wear support hose. Elevate your feet for 15 minutes, 3-4 times a day. Limit salt in your diet. Prenatal Care Write down your questions. Take them to your prenatal visits. Keep all your prenatal visits as told by your health care  provider. This is important. Safety Wear your seat belt at all times when driving. Make a list of emergency phone numbers, including numbers for family, friends, the hospital, and police and fire departments. General instructions Ask your health care provider for a referral to a local prenatal education class. Begin classes no later than the beginning of month 6 of your pregnancy. Ask for help if you have counseling or nutritional needs during pregnancy. Your health care provider can offer advice or refer you to specialists for help with various needs. Do not use hot tubs, steam rooms, or saunas. Do not douche or use tampons or scented sanitary pads. Do not cross your legs for long periods of time. Avoid cat litter boxes and soil used by cats. These carry germs that can cause birth defects in the baby and possibly loss of the fetus by miscarriage or stillbirth. Avoid all smoking, herbs, alcohol, and unprescribed drugs. Chemicals in these products can affect the formation and growth of the baby. Do not use any products that contain nicotine or tobacco, such as cigarettes and e-cigarettes. If  you need help quitting, ask your health care provider. Visit your dentist if you have not gone yet during your pregnancy. Use a soft toothbrush to brush your teeth and be gentle when you floss. Contact a health care provider if: You have dizziness. You have mild pelvic cramps, pelvic pressure, or nagging pain in the abdominal area. You have persistent nausea, vomiting, or diarrhea. You have a bad smelling vaginal discharge. You have pain when you urinate. Get help right away if: You have a fever. You are leaking fluid from your vagina. You have spotting or bleeding from your vagina. You have severe abdominal cramping or pain. You have rapid weight gain or weight loss. You have shortness of breath with chest pain. You notice sudden or extreme swelling of your face, hands, ankles, feet, or legs. You have not felt your baby move in over an hour. You have severe headaches that do not go away when you take medicine. You have vision changes. Summary The second trimester is from week 14 through week 27 (months 4 through 6). It is also a time when the fetus is growing rapidly. Your body goes through many changes during pregnancy. The changes vary from woman to woman. Avoid all smoking, herbs, alcohol, and unprescribed drugs. These chemicals affect the formation and growth your baby. Do not use any tobacco products, such as cigarettes, chewing tobacco, and e-cigarettes. If you need help quitting, ask your health care provider. Contact your health care provider if you have any questions. Keep all prenatal visits as told by your health care provider. This is important. This information is not intended to replace advice given to you by your health care provider. Make sure you discuss any questions you have with your health care provider. Document Released: 04/10/2001 Document Revised: 09/22/2015 Document Reviewed: 06/17/2012 Elsevier Interactive Patient Education  2017 [employer].

## 2024-05-22 ENCOUNTER — Encounter: Payer: Self-pay | Admitting: *Deleted

## 2024-05-25 ENCOUNTER — Encounter: Payer: Self-pay | Admitting: *Deleted

## 2024-05-25 ENCOUNTER — Ambulatory Visit

## 2024-05-26 ENCOUNTER — Ambulatory Visit

## 2024-05-26 VITALS — BP 130/82 | HR 83

## 2024-05-26 DIAGNOSIS — Z3A21 21 weeks gestation of pregnancy: Secondary | ICD-10-CM

## 2024-05-26 DIAGNOSIS — O10912 Unspecified pre-existing hypertension complicating pregnancy, second trimester: Secondary | ICD-10-CM

## 2024-05-26 DIAGNOSIS — O10919 Unspecified pre-existing hypertension complicating pregnancy, unspecified trimester: Secondary | ICD-10-CM

## 2024-05-26 DIAGNOSIS — O0990 Supervision of high risk pregnancy, unspecified, unspecified trimester: Secondary | ICD-10-CM

## 2024-05-26 NOTE — Progress Notes (Signed)
" ° °  NURSE VISIT- BLOOD PRESSURE CHECK  SUBJECTIVE:  Kendra [name] is a 34 y.o. [phone] female here for BP check. She is [redacted]w[redacted]d pregnant    HYPERTENSION ROS:  Pregnant:  Severe headaches that don't go away with tylenol /other medicines: No  Visual changes (seeing spots/double/blurred vision) No  Severe pain under right breast breast or in center of upper chest No  Severe nausea/vomiting No  Taking medicines as instructed yes   OBJECTIVE:  BP 130/82   Pulse 83   LMP 12/29/2023 (Exact Date)   Appearance alert, well appearing, and in no distress.  ASSESSMENT: Pregnancy [redacted]w[redacted]d  blood pressure check  PLAN: Discussed with Luke Fetters, CNM, [facility]   Recommendations: no changes needed   Follow-up: as scheduled   Maxamilian Amadon  05/26/2024 3:51 PM  "

## 2024-06-16 ENCOUNTER — Other Ambulatory Visit

## 2024-06-16 ENCOUNTER — Encounter: Admitting: Women's Health
